# Patient Record
Sex: Male | Born: 1975 | Race: White | Hispanic: No | Smoking: Former smoker
Health system: Southern US, Community
[De-identification: ages and names within clinical notes are randomized; demographics above are authoritative.]

## PROBLEM LIST (undated history)

## (undated) DIAGNOSIS — F32A Depression, unspecified: Secondary | ICD-10-CM

## (undated) DIAGNOSIS — F419 Anxiety disorder, unspecified: Secondary | ICD-10-CM

## (undated) DIAGNOSIS — F329 Major depressive disorder, single episode, unspecified: Secondary | ICD-10-CM

## (undated) DIAGNOSIS — D849 Immunodeficiency, unspecified: Secondary | ICD-10-CM

## (undated) DIAGNOSIS — Z21 Asymptomatic human immunodeficiency virus [HIV] infection status: Secondary | ICD-10-CM

## (undated) DIAGNOSIS — K219 Gastro-esophageal reflux disease without esophagitis: Secondary | ICD-10-CM

---

## 2007-10-11 ENCOUNTER — Emergency Department: Payer: Self-pay | Admitting: Emergency Medicine

## 2008-01-24 ENCOUNTER — Emergency Department: Payer: Self-pay | Admitting: Emergency Medicine

## 2008-05-17 ENCOUNTER — Emergency Department: Payer: Self-pay | Admitting: Emergency Medicine

## 2009-08-23 ENCOUNTER — Emergency Department: Payer: Self-pay | Admitting: Emergency Medicine

## 2009-10-30 ENCOUNTER — Emergency Department: Payer: Self-pay | Admitting: Emergency Medicine

## 2010-01-10 ENCOUNTER — Emergency Department: Payer: Self-pay | Admitting: Emergency Medicine

## 2010-03-07 ENCOUNTER — Emergency Department: Payer: Self-pay | Admitting: Internal Medicine

## 2012-10-04 ENCOUNTER — Emergency Department: Payer: Self-pay | Admitting: Emergency Medicine

## 2012-10-04 LAB — COMPREHENSIVE METABOLIC PANEL
Albumin: 3.8 g/dL (ref 3.4–5.0)
Alkaline Phosphatase: 85 U/L (ref 50–136)
Calcium, Total: 9.2 mg/dL (ref 8.5–10.1)
Chloride: 104 mmol/L (ref 98–107)
Co2: 27 mmol/L (ref 21–32)
EGFR (African American): >60
EGFR (Non-African Amer.): >60
Osmolality: 276 (ref 275–301)
SGOT(AST): 41 U/L — ABNORMAL HIGH (ref 15–37)
SGPT (ALT): 52 U/L (ref 12–78)
Sodium: 139 mmol/L (ref 136–145)

## 2012-10-04 LAB — CBC
MCH: 33 pg (ref 26.0–34.0)
Platelet: 205 10*3/uL (ref 150–440)
RBC: 4.92 10*6/uL (ref 4.40–5.90)
RDW: 14.2 % (ref 11.5–14.5)
WBC: 6.1 10*3/uL (ref 3.8–10.6)

## 2012-10-10 ENCOUNTER — Emergency Department: Payer: Self-pay | Admitting: Emergency Medicine

## 2012-10-10 LAB — BASIC METABOLIC PANEL
Anion Gap: 9 (ref 7–16)
Chloride: 105 mmol/L (ref 98–107)
Co2: 26 mmol/L (ref 21–32)
Creatinine: 1.21 mg/dL (ref 0.60–1.30)
EGFR (African American): >60
Potassium: 3.8 mmol/L (ref 3.5–5.1)
Sodium: 140 mmol/L (ref 136–145)

## 2012-10-10 LAB — CBC
HCT: 40.8 % (ref 40.0–52.0)
HGB: 14.4 g/dL (ref 13.0–18.0)
MCH: 32.2 pg (ref 26.0–34.0)
MCHC: 35.3 g/dL (ref 32.0–36.0)
MCV: 91 fL (ref 80–100)
RBC: 4.48 10*6/uL (ref 4.40–5.90)

## 2013-10-06 ENCOUNTER — Emergency Department: Payer: Self-pay | Admitting: Emergency Medicine

## 2013-11-21 ENCOUNTER — Emergency Department: Payer: Self-pay | Admitting: Emergency Medicine

## 2013-11-21 LAB — CBC
Platelet: 257 10*3/uL (ref 150–440)
RBC: 4.42 10*6/uL (ref 4.40–5.90)

## 2013-11-21 LAB — COMPREHENSIVE METABOLIC PANEL
Anion Gap: 5 — ABNORMAL LOW (ref 7–16)
BUN: 17 mg/dL (ref 7–18)
Co2: 26 mmol/L (ref 21–32)
EGFR (African American): >60
Glucose: 116 mg/dL — ABNORMAL HIGH (ref 65–99)
SGOT(AST): 36 U/L (ref 15–37)
SGPT (ALT): 32 U/L (ref 12–78)
Sodium: 138 mmol/L (ref 136–145)

## 2013-11-21 LAB — URINALYSIS, COMPLETE
Bacteria: NONE SEEN
Bilirubin,UR: NEGATIVE
Glucose,UR: NEGATIVE mg/dL (ref 0–75)
Nitrite: NEGATIVE
Ph: 5 (ref 4.5–8.0)
RBC,UR: 193 /HPF (ref 0–5)
Specific Gravity: 1.021 (ref 1.003–1.030)
Squamous Epithelial: <1

## 2014-06-05 ENCOUNTER — Emergency Department: Payer: Self-pay | Admitting: Emergency Medicine

## 2014-07-26 ENCOUNTER — Emergency Department: Payer: Self-pay | Admitting: Emergency Medicine

## 2014-10-01 ENCOUNTER — Emergency Department: Payer: Self-pay | Admitting: Emergency Medicine

## 2014-10-18 ENCOUNTER — Emergency Department: Payer: Self-pay | Admitting: Emergency Medicine

## 2014-10-18 LAB — PHOSPHORUS: Phosphorus: 3.1 mg/dL (ref 2.5–4.9)

## 2014-10-18 LAB — COMPREHENSIVE METABOLIC PANEL
AST: 18 U/L (ref 15–37)
Albumin: 3 g/dL — ABNORMAL LOW (ref 3.4–5.0)
Alkaline Phosphatase: 87 U/L
Anion Gap: 9 (ref 7–16)
BILIRUBIN TOTAL: 1.3 mg/dL — AB (ref 0.2–1.0)
BUN: 13 mg/dL (ref 7–18)
CALCIUM: 9 mg/dL (ref 8.5–10.1)
CHLORIDE: 101 mmol/L (ref 98–107)
CO2: 26 mmol/L (ref 21–32)
Creatinine: 1.48 mg/dL — ABNORMAL HIGH (ref 0.60–1.30)
EGFR (African American): >60
EGFR (Non-African Amer.): 56 — ABNORMAL LOW
Glucose: 107 mg/dL — ABNORMAL HIGH (ref 65–99)
OSMOLALITY: 273 (ref 275–301)
Potassium: 3.9 mmol/L (ref 3.5–5.1)
SGPT (ALT): 30 U/L
Sodium: 136 mmol/L (ref 136–145)
TOTAL PROTEIN: 8.4 g/dL — AB (ref 6.4–8.2)

## 2014-10-18 LAB — CBC
HCT: 41.9 % (ref 40.0–52.0)
HGB: 13.6 g/dL (ref 13.0–18.0)
MCH: 29.5 pg (ref 26.0–34.0)
MCHC: 32.6 g/dL (ref 32.0–36.0)
MCV: 91 fL (ref 80–100)
Platelet: 341 10*3/uL (ref 150–440)
RBC: 4.63 10*6/uL (ref 4.40–5.90)
RDW: 14.5 % (ref 11.5–14.5)
WBC: 12.9 10*3/uL — AB (ref 3.8–10.6)

## 2014-10-18 LAB — PROTIME-INR
INR: 1
Prothrombin Time: 13.3 secs (ref 11.5–14.7)

## 2014-10-18 LAB — MAGNESIUM: Magnesium: 1.8 mg/dL

## 2014-10-18 LAB — TROPONIN I: Troponin-I: <0.02 ng/mL

## 2014-10-19 LAB — URINALYSIS, COMPLETE
BACTERIA: NONE SEEN
Bilirubin,UR: NEGATIVE
Blood: NEGATIVE
Glucose,UR: NEGATIVE mg/dL (ref 0–75)
KETONE: NEGATIVE
Leukocyte Esterase: NEGATIVE
NITRITE: NEGATIVE
PH: 7 (ref 4.5–8.0)
Protein: NEGATIVE
RBC,UR: NONE SEEN /HPF (ref 0–5)
SPECIFIC GRAVITY: 1.021 (ref 1.003–1.030)
Squamous Epithelial: NONE SEEN
WBC UR: NONE SEEN /HPF (ref 0–5)

## 2014-10-20 LAB — URINE CULTURE

## 2014-10-23 LAB — CULTURE, BLOOD (SINGLE)

## 2014-12-05 ENCOUNTER — Emergency Department: Payer: Self-pay | Admitting: Emergency Medicine

## 2014-12-08 ENCOUNTER — Emergency Department: Payer: Self-pay | Admitting: Emergency Medicine

## 2014-12-08 LAB — BASIC METABOLIC PANEL
ANION GAP: 4 — AB (ref 7–16)
BUN: 17 mg/dL (ref 7–18)
CALCIUM: 9 mg/dL (ref 8.5–10.1)
CHLORIDE: 108 mmol/L — AB (ref 98–107)
CO2: 26 mmol/L (ref 21–32)
Creatinine: 0.96 mg/dL (ref 0.60–1.30)
EGFR (African American): >60
EGFR (Non-African Amer.): >60
Glucose: 91 mg/dL (ref 65–99)
OSMOLALITY: 277 (ref 275–301)
Potassium: 3.5 mmol/L (ref 3.5–5.1)
SODIUM: 138 mmol/L (ref 136–145)

## 2014-12-08 LAB — CBC WITH DIFFERENTIAL/PLATELET
Basophil #: 0.1 10*3/uL (ref 0.0–0.1)
Basophil %: 0.7 %
EOS ABS: 0.2 10*3/uL (ref 0.0–0.7)
EOS PCT: 1.7 %
HCT: 42.8 % (ref 40.0–52.0)
HGB: 14.2 g/dL (ref 13.0–18.0)
Lymphocyte #: 1.9 10*3/uL (ref 1.0–3.6)
Lymphocyte %: 18.7 %
MCH: 30.4 pg (ref 26.0–34.0)
MCHC: 33.1 g/dL (ref 32.0–36.0)
MCV: 92 fL (ref 80–100)
MONO ABS: 1 x10 3/mm (ref 0.2–1.0)
Monocyte %: 9.2 %
Neutrophil #: 7.2 10*3/uL — ABNORMAL HIGH (ref 1.4–6.5)
Neutrophil %: 69.7 %
Platelet: 279 10*3/uL (ref 150–440)
RBC: 4.65 10*6/uL (ref 4.40–5.90)
RDW: 16.5 % — AB (ref 11.5–14.5)
WBC: 10.4 10*3/uL (ref 3.8–10.6)

## 2015-06-26 ENCOUNTER — Encounter: Payer: Self-pay | Admitting: Emergency Medicine

## 2015-06-26 ENCOUNTER — Emergency Department
Admission: EM | Admit: 2015-06-26 | Discharge: 2015-06-26 | Disposition: A | Discharge status: Home / Self Care | Payer: Self-pay | Attending: Emergency Medicine | Admitting: Emergency Medicine

## 2015-06-26 ENCOUNTER — Emergency Department: Payer: Self-pay

## 2015-06-26 DIAGNOSIS — Y9289 Other specified places as the place of occurrence of the external cause: Secondary | ICD-10-CM | POA: Insufficient documentation

## 2015-06-26 DIAGNOSIS — S60221A Contusion of right hand, initial encounter: Secondary | ICD-10-CM | POA: Insufficient documentation

## 2015-06-26 DIAGNOSIS — W228XXA Striking against or struck by other objects, initial encounter: Secondary | ICD-10-CM | POA: Insufficient documentation

## 2015-06-26 DIAGNOSIS — Z720 Tobacco use: Secondary | ICD-10-CM | POA: Insufficient documentation

## 2015-06-26 DIAGNOSIS — Y9389 Activity, other specified: Secondary | ICD-10-CM | POA: Insufficient documentation

## 2015-06-26 DIAGNOSIS — Y990 Civilian activity done for income or pay: Secondary | ICD-10-CM | POA: Insufficient documentation

## 2015-06-26 HISTORY — DX: Anxiety disorder, unspecified: F41.9

## 2015-06-26 MED ORDER — MELOXICAM 15 MG PO TABS: 15.0000 mg | ORAL_TABLET | Freq: Every day | ORAL | Status: DC | PRN

## 2015-06-26 MED ORDER — OXYCODONE-ACETAMINOPHEN 5-325 MG PO TABS: 1.0000 | ORAL_TABLET | Freq: Three times a day (TID) | ORAL | Status: DC | PRN

## 2015-06-26 NOTE — Discharge Instructions (Signed)
Wear splint as needed for pain. Take medications as prescribed. Apply ice and rest. Avoid strenuous activity. Follow-up with orthopedic as needed for continued pain.  Return to the ER for new or worsening concerns.  Contusion A contusion is a deep bruise. Contusions are the result of an injury that caused bleeding under the skin. The contusion may turn blue, purple, or yellow. Minor injuries will give you a painless contusion, but more severe contusions may stay painful and swollen for a few weeks.  CAUSES  A contusion is usually caused by a blow, trauma, or direct force to an area of the body. SYMPTOMS   Swelling and redness of the injured area.  Bruising of the injured area.  Tenderness and soreness of the injured area.  Pain. DIAGNOSIS  The diagnosis can be made by taking a history and physical exam. An X-ray, CT scan, or MRI may be needed to determine if there were any associated injuries, such as fractures. TREATMENT  Specific treatment will depend on what area of the body was injured. In general, the best treatment for a contusion is resting, icing, elevating, and applying cold compresses to the injured area. Over-the-counter medicines may also be recommended for pain control. Ask your caregiver what the best treatment is for your contusion. HOME CARE INSTRUCTIONS   Put ice on the injured area.  Put ice in a plastic bag.  Place a towel between your skin and the bag.  Leave the ice on for 15-20 minutes, 3-4 times a day, or as directed by your health care provider.  Only take over-the-counter or prescription medicines for pain, discomfort, or fever as directed by your caregiver. Your caregiver may recommend avoiding anti-inflammatory medicines (aspirin, ibuprofen, and naproxen) for 48 hours because these medicines may increase bruising.  Rest the injured area.  If possible, elevate the injured area to reduce swelling. SEEK IMMEDIATE MEDICAL CARE IF:   You have increased  bruising or swelling.  You have pain that is getting worse.  Your swelling or pain is not relieved with medicines. MAKE SURE YOU:   Understand these instructions.  Will watch your condition.  Will get help right away if you are not doing well or get worse. Document Released: 09/09/2005 Document Revised: 12/05/2013 Document Reviewed: 10/05/2011 [facility] Patient Information 2015 [employer] [place]. This information is not intended to replace advice given to you by your health care provider. Make sure you discuss any questions you have with your health care provider.

## 2015-06-26 NOTE — ED Notes (Signed)
States he had something fall on right hand about 3 weeks ago. conts to have pain

## 2015-06-26 NOTE — ED Provider Notes (Signed)
[facility] Emergency Department Provider Note  ____________________________________________  Time seen: Approximately 3:44 PM  I have reviewed the triage vital signs and the nursing notes.   HISTORY  Chief Complaint Hand Pain   HPI Lance Reynolds is a 39 y.o. male presents to the ER for complaints of right lateral hand pain. Patient reports that 2-3 weeks ago he was at work and and he hit his hand on a car part or a car part fell on his right hand that he has had continued pain since then. Denies fall or other injury. Denies head injury or loss consciousness. Patient reports that he is right-handed and has pain with gripping things and with frequent movements.   Patient states that current pain is 6 out of 10 to right lateral hand. Patient states that pain is primarily with movement. Denies pain radiation. Denies break in skin. Denies other injury.     Past Medical History  Diagnosis Date  . Anxiety     There are no active problems to display for this patient.   History reviewed. No pertinent past surgical history.  No current outpatient prescriptions on file.  Allergies Tramadol  History reviewed. No pertinent family history.  Social History History  Substance Use Topics  . Smoking status: Current Every Day Smoker  . Smokeless tobacco: Not on file  . Alcohol Use: No    Review of Systems Constitutional: No fever/chills Eyes: No visual changes. ENT: No sore throat. Cardiovascular: Denies chest pain. Respiratory: Denies shortness of breath. Gastrointestinal: No abdominal pain.  No nausea, no vomiting.  No diarrhea.  No constipation. Genitourinary: Negative for dysuria. Musculoskeletal: Negative for back pain.right hand pain. Skin: Negative for rash. Neurological: Negative for headaches, focal weakness or numbness.  10-point ROS otherwise negative.  ____________________________________________   PHYSICAL EXAM:  VITAL SIGNS: ED  Triage Vitals  Enc Vitals Group     BP 06/26/15 1405 121/77 mmHg     Pulse Rate 06/26/15 1405 80     Resp 06/26/15 1405 20     Temp 06/26/15 1405 98.6 F (37 C)     Temp Source 06/26/15 1405 Oral     SpO2 06/26/15 1405 98 %     Weight 06/26/15 1405 198 lb (89.812 kg)     Height 06/26/15 1405 6\' 1"  (1.854 m)     Head Cir --      Peak Flow --      Pain Score 06/26/15 1405 8     Pain Loc --      Pain Edu? --      Excl. in GC? --     Constitutional: Alert and oriented. Well appearing and in no acute distress. Eyes: Conjunctivae are normal. PERRL. EOMI. Head: Atraumatic. Nose: No congestion/rhinnorhea. Mouth/Throat: Mucous membranes are moist.   Neck: No stridor.  No cervical spine tenderness to palpation. Hematological/Lymphatic/Immunilogical: No cervical lymphadenopathy. Cardiovascular: Normal rate, regular rhythm. Grossly normal heart sounds.  Good peripheral circulation. Respiratory: Normal respiratory effort.  No retractions. Lungs CTAB. Gastrointestinal: Soft and nontender. No distention.  Musculoskeletal: No lower or upper extremity tenderness nor edema.  No joint effusions. Right hand mild to mod TTP over dorsal 4/5 metacarpal, no swelling or ecchymosis. No erythema, induration or drainage. Motor and sensation intact. No tendon deficit.  Neurologic:  Normal speech and language. No gross focal neurologic deficits are appreciated. No gait instability. Skin:  Skin is warm, dry and intact. No rash noted. Psychiatric: Mood and affect are normal. Speech and  behavior are normal.  ____________________________________________   LABS (all labs ordered are listed, but only abnormal results are displayed)  Labs Reviewed - No data to display  RADIOLOGY  RIGHT HAND - COMPLETE 3+ VIEW  COMPARISON: None.  FINDINGS: There is no evidence of fracture or dislocation. There is no evidence of arthropathy or other focal bone abnormality. Soft tissues are  unremarkable.  IMPRESSION: Negative.   Electronically Signed By: Elige Ko On: 06/26/2015 15:59  I, Renford Dills, personally discussed these images and results by phone with the on-call radiologist and used this discussion as part of my medical decision making.   ____________________________________________   PROCEDURES  Procedure(s) performed:  SPLINT APPLICATION Date/Time: 4:16 PM Authorized by: Renford Dills Consent: Verbal consent obtained. Risks and benefits: risks, benefits and alternatives were discussed Consent given by: patient Splint applied by: ed technician Location details: right velcro cock up splint Post-procedure: The splinted body part was neurovascularly unchanged following the procedure. Patient tolerance: Patient tolerated the procedure well with no immediate complications.     _________________________________   INITIAL IMPRESSION / ASSESSMENT AND PLAN / ED COURSE  Pertinent labs & imaging results that were available during my care of the patient were reviewed by me and considered in my medical decision making (see chart for details).  Very well-appearing patient. No acute distress. Presents the ER for the complaint of right lateral hand pain post injury 2-3 weeks ago after hitting it on a car part. Patient denies other pain or other injury. Patient reports he is right-handed and continues to have intermittent pain. Patient states the pain is primarily with movement. Denies numbness or tingling sensation. Right hand x-ray negative for acute bony abnormality. Discussed with patient to rest and apply ice and elevate. We'll discharge patient with a Velcro cock-up splint for her restless instability. When necessary Mobic, #4 percocet. Patient to follow-up with orthopedic as needed for continued pain. Patient verbalized understanding and agreed to plan. ____________________________________________   FINAL CLINICAL IMPRESSION(S) / ED  DIAGNOSES  Final diagnoses:  Hand contusion, right, initial encounter      Renford Dills, NP 06/26/15 1619  Minna Antis, MD 06/26/15 2031

## 2015-07-17 ENCOUNTER — Emergency Department
Admission: EM | Admit: 2015-07-17 | Discharge: 2015-07-17 | Disposition: A | Discharge status: Home / Self Care | Payer: Self-pay | Attending: Emergency Medicine | Admitting: Emergency Medicine

## 2015-07-17 ENCOUNTER — Encounter: Payer: Self-pay | Admitting: Emergency Medicine

## 2015-07-17 DIAGNOSIS — Z720 Tobacco use: Secondary | ICD-10-CM | POA: Insufficient documentation

## 2015-07-17 DIAGNOSIS — R21 Rash and other nonspecific skin eruption: Secondary | ICD-10-CM | POA: Insufficient documentation

## 2015-07-17 DIAGNOSIS — L720 Epidermal cyst: Secondary | ICD-10-CM | POA: Insufficient documentation

## 2015-07-17 DIAGNOSIS — J029 Acute pharyngitis, unspecified: Secondary | ICD-10-CM | POA: Insufficient documentation

## 2015-07-17 LAB — CBC WITH DIFFERENTIAL/PLATELET
BASOS ABS: 0 10*3/uL (ref 0–0.1)
Basophils Relative: 0 %
Eosinophils Absolute: 0.3 10*3/uL (ref 0–0.7)
Eosinophils Relative: 5 %
HCT: 37.1 % — ABNORMAL LOW (ref 40.0–52.0)
HEMOGLOBIN: 12.4 g/dL — AB (ref 13.0–18.0)
Lymphocytes Relative: 21 %
Lymphs Abs: 1.3 10*3/uL (ref 1.0–3.6)
MCH: 29.7 pg (ref 26.0–34.0)
MCHC: 33.4 g/dL (ref 32.0–36.0)
MCV: 88.9 fL (ref 80.0–100.0)
MONO ABS: 0.4 10*3/uL (ref 0.2–1.0)
Monocytes Relative: 7 %
NEUTROS ABS: 3.9 10*3/uL (ref 1.4–6.5)
Neutrophils Relative %: 67 %
Platelets: 250 10*3/uL (ref 150–440)
RBC: 4.17 MIL/uL — AB (ref 4.40–5.90)
RDW: 14.5 % (ref 11.5–14.5)
WBC: 6 10*3/uL (ref 3.8–10.6)

## 2015-07-17 LAB — COMPREHENSIVE METABOLIC PANEL
ALK PHOS: 46 U/L (ref 38–126)
ALT: 12 U/L — ABNORMAL LOW (ref 17–63)
ANION GAP: 7 (ref 5–15)
AST: 19 U/L (ref 15–41)
Albumin: 3.3 g/dL — ABNORMAL LOW (ref 3.5–5.0)
BUN: 13 mg/dL (ref 6–20)
CALCIUM: 8.5 mg/dL — AB (ref 8.9–10.3)
CHLORIDE: 103 mmol/L (ref 101–111)
CO2: 23 mmol/L (ref 22–32)
CREATININE: 0.9 mg/dL (ref 0.61–1.24)
GFR calc Af Amer: >60 mL/min (ref >60)
GFR calc non Af Amer: >60 mL/min (ref >60)
Glucose, Bld: 104 mg/dL — ABNORMAL HIGH (ref 65–99)
POTASSIUM: 3.5 mmol/L (ref 3.5–5.1)
SODIUM: 133 mmol/L — AB (ref 135–145)
Total Bilirubin: 0.5 mg/dL (ref 0.3–1.2)
Total Protein: 7.9 g/dL (ref 6.5–8.1)

## 2015-07-17 LAB — POCT RAPID STREP A: Streptococcus, Group A Screen (Direct): NEGATIVE

## 2015-07-17 MED ORDER — CEPHALEXIN 500 MG PO CAPS: 500.0000 mg | ORAL_CAPSULE | Freq: Four times a day (QID) | ORAL | Status: AC

## 2015-07-17 MED ORDER — SODIUM CHLORIDE 0.9 % IV BOLUS (SEPSIS)
1000.0000 mL | Freq: Once | INTRAVENOUS | Status: AC
  Administered 2015-07-17: 1000 mL via INTRAVENOUS

## 2015-07-17 MED ORDER — PERMETHRIN 5 % EX CREA: TOPICAL_CREAM | CUTANEOUS | Status: DC

## 2015-07-17 MED ORDER — ACETAMINOPHEN-CODEINE #3 300-30 MG PO TABS: 1.0000 | ORAL_TABLET | ORAL | Status: DC | PRN

## 2015-07-17 NOTE — ED Provider Notes (Signed)
[facility] Emergency Department Provider Note ____________________________________________  Time seen: Approximately 6:52 PM   I have reviewed the triage vital signs and the nursing notes.   HISTORY  Chief Complaint Sore Throat   HPI Lance Reynolds is a 39 y.o. male who presents to the emergency department for evaluation of sore throat, lesion on his forehead, and rash. Sore throat has been present for the past 2 weeks. Lesion on his head has been getting larger over the past few days. Rash has been present for an unknown period of time. He describes it as pruritic. He states it might be from bites that he received while lying on the ground working underneath some machinery.   Past Medical History  Diagnosis Date  . Anxiety     There are no active problems to display for this patient.   History reviewed. No pertinent past surgical history.  Current Outpatient Rx  Name  Route  Sig  Dispense  Refill  . acetaminophen-codeine (TYLENOL #3) 300-30 MG per tablet   Oral   Take 1-2 tablets by mouth every 4 (four) hours as needed for moderate pain.   12 tablet   0   . cephALEXin (KEFLEX) 500 MG capsule   Oral   Take 1 capsule (500 mg total) by mouth 4 (four) times daily.   40 capsule   0   . meloxicam (MOBIC) 15 MG tablet   Oral   Take 1 tablet (15 mg total) by mouth daily as needed for pain.   10 tablet   0   . oxyCODONE-acetaminophen (ROXICET) 5-325 MG per tablet   Oral   Take 1 tablet by mouth every 8 (eight) hours as needed for moderate pain or severe pain (Do not drive or operate heavy machinery while taking as can cause drowsiness.).   4 tablet   0   . permethrin (ELIMITE) 5 % cream      Leave on overnight, then wash off in the morning. Repeat in 1 week   60 g   1     Allergies Tramadol  No family history on file.  Social History History  Substance Use Topics  . Smoking status: Current Every Day Smoker  . Smokeless tobacco:  Not on file  . Alcohol Use: No    Review of Systems Constitutional: No fever/positive for chills Eyes: No visual changes. ENT: Positive for sore throat. Cardiovascular: Denies chest pain. Respiratory: Denies shortness of breath. Gastrointestinal: No abdominal pain.  No nausea, no vomiting.  No diarrhea.  No constipation. Genitourinary: Negative for dysuria. Musculoskeletal: Negative for back pain. Skin: Negative for rash. Neurological: Negative for headaches, focal weakness or numbness.  10-point ROS otherwise negative.  ____________________________________________   PHYSICAL EXAM:  VITAL SIGNS: ED Triage Vitals  Enc Vitals Group     BP 07/17/15 1752 120/73 mmHg     Pulse Rate 07/17/15 1752 92     Resp 07/17/15 1752 20     Temp 07/17/15 1752 98.5 F (36.9 C)     Temp Source 07/17/15 1752 Oral     SpO2 07/17/15 1752 97 %     Weight 07/17/15 1751 190 lb (86.183 kg)     Height 07/17/15 1751 6\' 1"  (1.854 m)     Head Cir --      Peak Flow --      Pain Score 07/17/15 1751 9     Pain Loc --      Pain Edu? --  Excl. in GC? --     Constitutional: Alert and oriented. Very unkempt. Eyes: Conjunctivae are normal. PERRL. EOMI. Head: Atraumatic. Nose: No congestion/rhinnorhea. Mouth/Throat: Mucous membranes are  dry.  Oropharynx mildly erythematous without tonsillar edema or exudate. No uvular deviation. Neck: No stridor.   Cardiovascular: Normal rate, regular rhythm. Grossly normal heart sounds.  Good peripheral circulation. Respiratory: Normal respiratory effort.  No retractions. Lungs CTAB. Gastrointestinal: Soft and nontender. No distention. No abdominal bruits. No CVA tenderness. Musculoskeletal: No lower extremity tenderness nor edema.  No joint effusions. Neurologic:  Normal speech and language. No gross focal neurologic deficits are appreciated. No gait instability. Skin:  Skin is warm, dry and intact. Erythematous, scabbed rash noted over hands, wrists and  forearms. Epidermal cyst noted in the hairline of the frontal scalp area without cellulitis. Psychiatric: Mood and affect are normal. Speech and behavior are normal.  ____________________________________________   LABS (all labs ordered are listed, but only abnormal results are displayed)  Labs Reviewed  CBC WITH DIFFERENTIAL/PLATELET - Abnormal; Notable for the following:    RBC 4.17 (*)    Hemoglobin 12.4 (*)    HCT 37.1 (*)    All other components within normal limits  COMPREHENSIVE METABOLIC PANEL - Abnormal; Notable for the following:    Sodium 133 (*)    Glucose, Bld 104 (*)    Calcium 8.5 (*)    Albumin 3.3 (*)    ALT 12 (*)    All other components within normal limits  POCT RAPID STREP A   ____________________________________________  EKG   ____________________________________________  RADIOLOGY   ____________________________________________   PROCEDURES  Procedure(s) performed: None  Critical Care performed: No  ____________________________________________   INITIAL IMPRESSION / ASSESSMENT AND PLAN / ED COURSE  Pertinent labs & imaging results that were available during my care of the patient were reviewed by me and considered in my medical decision making (see chart for details).  1 L of IV fluid was given in the emergency department. Patient was advised to use the permethrin cream as prescribed overnight and wash off in the morning. He was strongly advised to establish a primary care provider for a complete physical. He was advised to return to the emergency department for symptoms change or worsen if unable schedule an appointment. ____________________________________________   FINAL CLINICAL IMPRESSION(S) / ED DIAGNOSES  Final diagnoses:  Epidermal cyst  Pharyngitis  Rash      Chinita Pester, FNP 07/17/15 2116  Sharman Cheek, MD 07/17/15 2310

## 2015-07-17 NOTE — ED Notes (Signed)
Assess per PA 

## 2015-07-17 NOTE — ED Notes (Signed)
Sore throat for few weeks .Marland Kitchen Pain is getting worse.Marland Kitchen

## 2015-07-17 NOTE — ED Notes (Signed)
AAOx3.  Skin warm and dry.  Reinforced importance of follow up with PCP.Marland Kitchen understanding verbalized.  D/C home

## 2015-07-17 NOTE — ED Notes (Signed)
Pt reports sore throat and "lump" on head for months.

## 2015-07-31 ENCOUNTER — Emergency Department
Admission: EM | Admit: 2015-07-31 | Discharge: 2015-07-31 | Disposition: A | Discharge status: Home / Self Care | Payer: Self-pay | Attending: Emergency Medicine | Admitting: Emergency Medicine

## 2015-07-31 ENCOUNTER — Encounter: Payer: Self-pay | Admitting: Emergency Medicine

## 2015-07-31 DIAGNOSIS — Z79899 Other long term (current) drug therapy: Secondary | ICD-10-CM | POA: Insufficient documentation

## 2015-07-31 DIAGNOSIS — B370 Candidal stomatitis: Secondary | ICD-10-CM

## 2015-07-31 DIAGNOSIS — Z720 Tobacco use: Secondary | ICD-10-CM | POA: Insufficient documentation

## 2015-07-31 DIAGNOSIS — B379 Candidiasis, unspecified: Secondary | ICD-10-CM | POA: Insufficient documentation

## 2015-07-31 DIAGNOSIS — K141 Geographic tongue: Secondary | ICD-10-CM | POA: Insufficient documentation

## 2015-07-31 MED ORDER — LIDOCAINE VISCOUS 2 % MT SOLN
15.0000 mL | Freq: Once | OROMUCOSAL | Status: AC
  Administered 2015-07-31: 15 mL via OROMUCOSAL
  Filled 2015-07-31: qty 15

## 2015-07-31 MED ORDER — FIRST-DUKES MOUTHWASH MT SUSP: 10.0000 mL | Freq: Four times a day (QID) | OROMUCOSAL | Status: DC

## 2015-07-31 NOTE — ED Notes (Signed)
Patient to ED with c/o painful white bumps to inside of mouth since yesterday, currently being treated with antibiotics.

## 2015-07-31 NOTE — Discharge Instructions (Signed)
Candida Infection °A Candida infection (also called yeast, fungus, and Monilia infection) is an overgrowth of yeast that can occur anywhere on the body. A yeast infection commonly occurs in warm, moist body areas. Usually, the infection remains localized but can spread to become a systemic infection. A yeast infection may be a sign of a more severe disease such as diabetes, leukemia, or AIDS. °A yeast infection can occur in both men and women. In women, Candida vaginitis is a vaginal infection. It is one of the most common causes of vaginitis. Men usually do not have symptoms or know they have an infection until other problems develop. Men may find out they have a yeast infection because their sex partner has a yeast infection. Uncircumcised men are more likely to get a yeast infection than circumcised men. This is because the uncircumcised glans is not exposed to air and does not remain as dry as that of a circumcised glans. Older adults may develop yeast infections around dentures. °CAUSES  °Women °· Antibiotics. °· Steroid medication taken for a long time. °· Being overweight (obese). °· Diabetes. °· Poor immune condition. °· Certain serious medical conditions. °· Immune suppressive medications for organ transplant patients. °· Chemotherapy. °· Pregnancy. °· Menstruation. °· Stress and fatigue. °· Intravenous drug use. °· Oral contraceptives. °· Wearing tight-fitting clothes in the crotch area. °· Catching it from a sex partner who has a yeast infection. °· Spermicide. °· Intravenous, urinary, or other catheters. °Men °· Catching it from a sex partner who has a yeast infection. °· Having oral or anal sex with a person who has the infection. °· Spermicide. °· Diabetes. °· Antibiotics. °· Poor immune system. °· Medications that suppress the immune system. °· Intravenous drug use. °· Intravenous, urinary, or other catheters. °SYMPTOMS  °Women °· Thick, white vaginal discharge. °· Vaginal itching. °· Redness and  swelling in and around the vagina. °· Irritation of the lips of the vagina and perineum. °· Blisters on the vaginal lips and perineum. °· Painful sexual intercourse. °· Low blood sugar (hypoglycemia). °· Painful urination. °· Bladder infections. °· Intestinal problems such as constipation, indigestion, bad breath, bloating, increase in gas, diarrhea, or loose stools. °Men °· Men may develop intestinal problems such as constipation, indigestion, bad breath, bloating, increase in gas, diarrhea, or loose stools. °· Dry, cracked skin on the penis with itching or discomfort. °· Jock itch. °· Dry, flaky skin. °· Athlete's foot. °· Hypoglycemia. °DIAGNOSIS  °Women °· A history and an exam are performed. °· The discharge may be examined under a microscope. °· A culture may be taken of the discharge. °Men °· A history and an exam are performed. °· Any discharge from the penis or areas of cracked skin will be looked at under the microscope and cultured. °· Stool samples may be cultured. °TREATMENT  °Women °· Vaginal antifungal suppositories and creams. °· Medicated creams to decrease irritation and itching on the outside of the vagina. °· Warm compresses to the perineal area to decrease swelling and discomfort. °· Oral antifungal medications. °· Medicated vaginal suppositories or cream for repeated or recurrent infections. °· Wash and dry the irritation areas before applying the cream. °· Eating yogurt with Lactobacillus may help with prevention and treatment. °· Sometimes painting the vagina with gentian violet solution may help if creams and suppositories do not work. °Men °· Antifungal creams and oral antifungal medications. °· Sometimes treatment must continue for 30 days after the symptoms go away to prevent recurrence. °HOME CARE INSTRUCTIONS  °  Women °· Use cotton underwear and avoid tight-fitting clothing. °· Avoid colored, scented toilet paper and deodorant tampons or pads. °· Do not douche. °· Keep your diabetes  under control. °· Finish all the prescribed medications. °· Keep your skin clean and dry. °· Consume milk or yogurt with Lactobacillus-active culture regularly. If you get frequent yeast infections and think that is what the infection is, there are over-the-counter medications that you can get. If the infection does not show healing in 3 days, talk to your caregiver. °· Tell your sex partner you have a yeast infection. Your partner may need treatment also, especially if your infection does not clear up or recurs. °Men °· Keep your skin clean and dry. °· Keep your diabetes under control. °· Finish all prescribed medications. °· Tell your sex partner that you have a yeast infection so he or she can be treated if necessary. °SEEK MEDICAL CARE IF:  °· Your symptoms do not clear up or worsen in one week after treatment. °· You have an oral temperature above 102° F (38.9° C). °· You have trouble swallowing or eating for a prolonged time. °· You develop blisters on and around your vagina. °· You develop vaginal bleeding and it is not your menstrual period. °· You develop abdominal pain. °· You develop intestinal problems as mentioned above. °· You get weak or light-headed. °· You have painful or increased urination. °· You have pain during sexual intercourse. °MAKE SURE YOU:  °· Understand these instructions. °· Will watch your condition. °· Will get help right away if you are not doing well or get worse. °Document Released: 01/07/2005 Document Revised: 04/16/2014 Document Reviewed: 04/21/2010 °Exit[employer]® Patient Information ©2015 [employer]. This information is not intended to replace advice given to you by your health care provider. Make sure you discuss any questions you have with your health care provider. °Thrush, Adult  °Thrush, also called oral candidiasis, is a fungal infection that develops in the mouth and throat and on the tongue. It causes white patches to form on the mouth and tongue. Thrush is most common  in older adults, but it can occur at any age.  °Many cases of thrush are mild, but this infection can also be more serious. Thrush can be a recurring problem for people who have chronic illnesses or who take medicines that limit the body's ability to fight infection. Because these people have difficulty fighting infections, the fungus that causes thrush can spread throughout the body. This can cause life-threatening blood or organ infections. °CAUSES  °Thrush is usually caused by a yeast called Candida albicans. This fungus is normally present in small amounts in the mouth and on other mucous membranes. It usually causes no harm. However, when conditions are present that allow the fungus to grow uncontrolled, it invades surrounding tissues and becomes an infection. Less often, other Candida species can also lead to thrush.  °RISK FACTORS °Thrush is more likely to develop in the following people: °People with an impaired ability to fight infection (weakened immune system).   °Older adults.   °People with HIV.   °People with diabetes.   °People with dry mouth (xerostomia).   °Pregnant women.   °People with poor dental care, especially those who have false teeth.   °People who use antibiotic medicines.   °SIGNS AND SYMPTOMS  °Thrush can be a mild infection that causes no symptoms. If symptoms develop, they may include:  °A burning feeling in the mouth and throat. This can occur at the start of a thrush infection.   °  White patches that adhere to the mouth and tongue. The tissue around the patches may be red, raw, and painful. If rubbed (during tooth brushing, for example), the patches and the tissue of the mouth may bleed easily.   °A bad taste in the mouth or difficulty tasting foods.   °Cottony feeling in the mouth.   °Pain during eating and swallowing. °DIAGNOSIS  °Your health care provider can usually diagnose thrush by looking in your mouth and asking you questions about your health.  °TREATMENT  °Medicines that  help prevent the growth of fungi (antifungals) are the standard treatment for thrush. These medicines are either applied directly to the affected area (topical) or swallowed (oral). The treatment will depend on the severity of the condition.  °Mild Thrush °Mild cases of thrush may clear up with the use of an antifungal mouth rinse or lozenges. Treatment usually lasts about 14 days.  °Moderate to Severe Thrush °More severe thrush infections that have spread to the esophagus are treated with an oral antifungal medicine. A topical antifungal medicine may also be used.   °For some severe infections, a treatment period longer than 14 days may be needed.   °Oral antifungal medicines are almost never used during pregnancy because the fetus may be harmed. However, if a pregnant woman has a rare, severe thrush infection that has spread to her blood, oral antifungal medicines may be used. In this case, the risk of harm to the mother and fetus from the severe thrush infection may be greater than the risk posed by the use of antifungal medicines.   °Persistent or Recurrent Thrush °For cases of thrush that do not go away or keep coming back, treatment may involve the following:  °Treatment may be needed twice as long as the symptoms last.   °Treatment will include both oral and topical antifungal medicines.   °People with weakened immune systems can take an antifungal medicine on a continuous basis to prevent thrush infections.   °It is important to treat conditions that make you more likely to get thrush, such as diabetes or HIV.  °HOME CARE INSTRUCTIONS  °Only take over-the-counter or prescription medicine as directed by your health care provider. Talk to your health care provider about an over-the-counter medicine called gentian violet, which kills bacteria and fungi.   °Eat plain, unflavored yogurt as directed by your health care provider. Check the label to make sure the yogurt contains live cultures. This yogurt can help  healthy bacteria grow in the mouth that can stop the growth of the fungus that causes thrush.   °Try these measures to help reduce the discomfort of thrush:   °Drink cold liquids such as water or iced tea.   °Try flavored ice treats or frozen juices.   °Eat foods that are easy to swallow, such as gelatin, ice cream, or custard.   °If the patches in your mouth are painful, try drinking from a straw.   °Rinse your mouth several times a day with a warm saltwater rinse. You can make the saltwater mixture with 1 tsp (6 g) of salt in 8 fl oz (0.2 L) of warm water.   °If you wear dentures, remove the dentures before going to bed, brush them vigorously, and soak them in a cleaning solution as directed by your health care provider.   °Women who are breastfeeding should clean their nipples with an antifungal medicine as directed by their health care provider. Dry the nipples after breastfeeding. Applying lanolin-containing body lotion may help relieve nipple soreness.   °SEEK MEDICAL CARE IF: °Your symptoms are getting worse or are not   improving within 7 days of starting treatment.   °You have symptoms of spreading infection, such as white patches on the skin outside of the mouth.   °You are nursing and you have redness, burning, or pain in the nipples that is not relieved with treatment.   °MAKE SURE YOU: °Understand these instructions. °Will watch your condition. °Will get help right away if you are not doing well or get worse. °Document Released: 08/25/2004 Document Revised: 09/20/2013 Document Reviewed: 07/03/2013 °ExitCare® Patient Information ©2015 [employer]. This information is not intended to replace advice given to you by your health care provider. Make sure you discuss any questions you have with your health care provider. ° °

## 2015-07-31 NOTE — ED Provider Notes (Signed)
[facility] Emergency Department Provider Note  ____________________________________________  Time seen: Approximately 4:51 PM  I have reviewed the triage vital signs and the nursing notes.   HISTORY  Chief Complaint Thrush   HPI Lance Reynolds is a 39 y.o. male who was seen 14 days ago and placed on antibiotics. Complains of of whitish rash in mouth. With funny-looking tongue.   Past Medical History  Diagnosis Date  . Anxiety     There are no active problems to display for this patient.   History reviewed. No pertinent past surgical history.  Current Outpatient Rx  Name  Route  Sig  Dispense  Refill  . acetaminophen-codeine (TYLENOL #3) 300-30 MG per tablet   Oral   Take 1-2 tablets by mouth every 4 (four) hours as needed for moderate pain.   12 tablet   0   . Diphenhyd-Hydrocort-Nystatin (FIRST-DUKES MOUTHWASH) SUSP   Mouth/Throat   Use as directed 10 mLs in the mouth or throat 4 (four) times daily.   240 mL   1     With viscous lidocaine   . meloxicam (MOBIC) 15 MG tablet   Oral   Take 1 tablet (15 mg total) by mouth daily as needed for pain.   10 tablet   0   . oxyCODONE-acetaminophen (ROXICET) 5-325 MG per tablet   Oral   Take 1 tablet by mouth every 8 (eight) hours as needed for moderate pain or severe pain (Do not drive or operate heavy machinery while taking as can cause drowsiness.).   4 tablet   0   . permethrin (ELIMITE) 5 % cream      Leave on overnight, then wash off in the morning. Repeat in 1 week   60 g   1     Allergies Tramadol  History reviewed. No pertinent family history.  Social History Social History  Substance Use Topics  . Smoking status: Current Every Day Smoker  . Smokeless tobacco: None  . Alcohol Use: No    Review of Systems Constitutional: No fever/chills Eyes: No visual changes. ENT: No sore throat. Positive whitish plaque and geographic tongue noted Cardiovascular: Denies chest  pain. Respiratory: Denies shortness of breath. Gastrointestinal: No abdominal pain.  No nausea, no vomiting.  No diarrhea.  No constipation. Genitourinary: Negative for dysuria. Musculoskeletal: Negative for back pain. Skin: Negative for rash. Neurological: Negative for headaches, focal weakness or numbness.  10-point ROS otherwise negative.  ____________________________________________   PHYSICAL EXAM:  VITAL SIGNS: ED Triage Vitals  Enc Vitals Group     BP --      Pulse --      Resp --      Temp --      Temp src --      SpO2 --      Weight --      Height --      Head Cir --      Peak Flow --      Pain Score 07/31/15 1635 10     Pain Loc --      Pain Edu? --      Excl. in GC? --     Constitutional: Alert and oriented. Well appearing and in no acute distress. Eyes: Conjunctivae are normal. PERRL. EOMI. Head: Atraumatic. Nose: No congestion/rhinnorhea. Mouth/Throat: Mucous membranes are moist.  Oropharynx non-erythematous. Whitish discharge noted on tongue and cheeks. Geographic tongue visible. Neck: No stridor.   Cardiovascular: Normal rate, regular rhythm. Grossly normal heart  sounds.  Good peripheral circulation. Respiratory: Normal respiratory effort.  No retractions. Lungs CTAB. Gastrointestinal: Soft and nontender. No distention. No abdominal bruits. No CVA tenderness. Musculoskeletal: No lower extremity tenderness nor edema.  No joint effusions. Neurologic:  Normal speech and language. No gross focal neurologic deficits are appreciated. No gait instability. Skin:  Skin is warm, dry and intact. No rash noted. Psychiatric: Mood and affect are normal. Speech and behavior are normal.  ____________________________________________   LABS (all labs ordered are listed, but only abnormal results are displayed)  Labs Reviewed - No data to display   PROCEDURES  Procedure(s) performed: None  Critical Care performed:  No  ____________________________________________   INITIAL IMPRESSION / ASSESSMENT AND PLAN / ED COURSE  Pertinent labs & imaging results that were available during my care of the patient were reviewed by me and considered in my medical decision making (see chart for details).  Thrush for geographic tongue. Prescribed for Dukes Magic mouthwash with viscous lidocaine. Patient follow-up with PCP or return to the ER if symptoms worsen. ____________________________________________   FINAL CLINICAL IMPRESSION(S) / ED DIAGNOSES  Final diagnoses:  Geographic tongue  Gabriel Earing, PA-C 07/31/15 1904  Phineas Semen, MD 07/31/15 2137

## 2015-09-18 ENCOUNTER — Emergency Department: Payer: Self-pay

## 2015-09-18 ENCOUNTER — Encounter: Payer: Self-pay | Admitting: Emergency Medicine

## 2015-09-18 ENCOUNTER — Inpatient Hospital Stay
Admission: EM | Admit: 2015-09-18 | Discharge: 2015-09-21 | DRG: 728 | Disposition: A | Discharge status: Home / Self Care | Payer: Self-pay | Attending: Internal Medicine | Admitting: Internal Medicine

## 2015-09-18 DIAGNOSIS — B002 Herpesviral gingivostomatitis and pharyngotonsillitis: Secondary | ICD-10-CM

## 2015-09-18 DIAGNOSIS — F17210 Nicotine dependence, cigarettes, uncomplicated: Secondary | ICD-10-CM | POA: Diagnosis present

## 2015-09-18 DIAGNOSIS — N50819 Testicular pain, unspecified: Secondary | ICD-10-CM

## 2015-09-18 DIAGNOSIS — N492 Inflammatory disorders of scrotum: Secondary | ICD-10-CM

## 2015-09-18 DIAGNOSIS — N5082 Scrotal pain: Secondary | ICD-10-CM

## 2015-09-18 DIAGNOSIS — L0390 Cellulitis, unspecified: Secondary | ICD-10-CM

## 2015-09-18 DIAGNOSIS — B20 Human immunodeficiency virus [HIV] disease: Secondary | ICD-10-CM

## 2015-09-18 DIAGNOSIS — B370 Candidal stomatitis: Secondary | ICD-10-CM | POA: Diagnosis present

## 2015-09-18 DIAGNOSIS — Z21 Asymptomatic human immunodeficiency virus [HIV] infection status: Secondary | ICD-10-CM

## 2015-09-18 LAB — URINALYSIS COMPLETE WITH MICROSCOPIC (ARMC ONLY)
Bilirubin Urine: NEGATIVE
GLUCOSE, UA: NEGATIVE mg/dL
Hgb urine dipstick: NEGATIVE
Ketones, ur: NEGATIVE mg/dL
Leukocytes, UA: NEGATIVE
Nitrite: NEGATIVE
PROTEIN: NEGATIVE mg/dL
RBC / HPF: NONE SEEN RBC/hpf (ref 0–5)
SQUAMOUS EPITHELIAL / LPF: NONE SEEN
Specific Gravity, Urine: 1.017 (ref 1.005–1.030)
pH: 6 (ref 5.0–8.0)

## 2015-09-18 LAB — COMPREHENSIVE METABOLIC PANEL
ALT: 32 U/L (ref 17–63)
ANION GAP: 6 (ref 5–15)
AST: 20 U/L (ref 15–41)
Albumin: 3.7 g/dL (ref 3.5–5.0)
Alkaline Phosphatase: 48 U/L (ref 38–126)
BILIRUBIN TOTAL: 0.6 mg/dL (ref 0.3–1.2)
BUN: 13 mg/dL (ref 6–20)
CO2: 26 mmol/L (ref 22–32)
Calcium: 9.2 mg/dL (ref 8.9–10.3)
Chloride: 107 mmol/L (ref 101–111)
Creatinine, Ser: 1.01 mg/dL (ref 0.61–1.24)
GFR calc non Af Amer: >60 mL/min (ref >60)
Glucose, Bld: 97 mg/dL (ref 65–99)
Potassium: 3.5 mmol/L (ref 3.5–5.1)
SODIUM: 139 mmol/L (ref 135–145)
TOTAL PROTEIN: 7.9 g/dL (ref 6.5–8.1)

## 2015-09-18 LAB — CBC WITH DIFFERENTIAL/PLATELET
Basophils Absolute: 0 10*3/uL (ref 0–0.1)
Basophils Relative: 1 %
EOS ABS: 0.1 10*3/uL (ref 0–0.7)
Eosinophils Relative: 2 %
HCT: 38.8 % — ABNORMAL LOW (ref 40.0–52.0)
Hemoglobin: 13 g/dL (ref 13.0–18.0)
Lymphocytes Relative: 18 %
Lymphs Abs: 1.2 10*3/uL (ref 1.0–3.6)
MCH: 30.2 pg (ref 26.0–34.0)
MCHC: 33.6 g/dL (ref 32.0–36.0)
MCV: 90 fL (ref 80.0–100.0)
MONO ABS: 0.7 10*3/uL (ref 0.2–1.0)
MONOS PCT: 10 %
NEUTROS PCT: 69 %
Neutro Abs: 4.7 10*3/uL (ref 1.4–6.5)
Platelets: 234 10*3/uL (ref 150–440)
RBC: 4.31 MIL/uL — ABNORMAL LOW (ref 4.40–5.90)
RDW: 15.8 % — AB (ref 11.5–14.5)
WBC: 6.8 10*3/uL (ref 3.8–10.6)

## 2015-09-18 LAB — CREATININE, SERUM: Creatinine, Ser: 0.9 mg/dL (ref 0.61–1.24)

## 2015-09-18 LAB — RAPID HIV SCREEN (HIV 1/2 AB+AG)
HIV 1/2 ANTIBODIES: REACTIVE — AB
HIV-1 P24 ANTIGEN - HIV24: NONREACTIVE

## 2015-09-18 LAB — TSH: TSH: 1.171 u[IU]/mL (ref 0.350–4.500)

## 2015-09-18 LAB — CHLAMYDIA/NGC RT PCR (ARMC ONLY)
CHLAMYDIA TR: NOT DETECTED
N GONORRHOEAE: NOT DETECTED

## 2015-09-18 LAB — HEMOGLOBIN A1C: Hgb A1c MFr Bld: 5.2 % (ref 4.0–6.0)

## 2015-09-18 MED ORDER — ONDANSETRON HCL 4 MG/2ML IJ SOLN: 4.0000 mg | Freq: Four times a day (QID) | INTRAMUSCULAR | Status: DC | PRN

## 2015-09-18 MED ORDER — HYDROCODONE-ACETAMINOPHEN 5-325 MG PO TABS
1.0000 | ORAL_TABLET | ORAL | Status: DC | PRN
  Administered 2015-09-18: 12:00:00 2 via ORAL
  Administered 2015-09-18: 1 via ORAL
  Filled 2015-09-18 (×2): qty 2

## 2015-09-18 MED ORDER — NYSTATIN-TRIAMCINOLONE 100000-0.1 UNIT/GM-% EX CREA
TOPICAL_CREAM | Freq: Two times a day (BID) | CUTANEOUS | Status: DC
  Administered 2015-09-18: 1 via TOPICAL
  Administered 2015-09-19 – 2015-09-20 (×4): via TOPICAL
  Administered 2015-09-21: 1 via TOPICAL
  Filled 2015-09-18 (×4): qty 15

## 2015-09-18 MED ORDER — ONDANSETRON HCL 4 MG PO TABS: 4.0000 mg | ORAL_TABLET | Freq: Four times a day (QID) | ORAL | Status: DC | PRN

## 2015-09-18 MED ORDER — MORPHINE SULFATE (PF) 4 MG/ML IV SOLN
4.0000 mg | Freq: Once | INTRAVENOUS | Status: AC
  Administered 2015-09-18: 4 mg via INTRAVENOUS
  Filled 2015-09-18: qty 1

## 2015-09-18 MED ORDER — ENOXAPARIN SODIUM 100 MG/ML ~~LOC~~ SOLN: 1.0000 mg/kg | Freq: Two times a day (BID) | SUBCUTANEOUS | Status: DC

## 2015-09-18 MED ORDER — VANCOMYCIN HCL 10 G IV SOLR
1250.0000 mg | Freq: Two times a day (BID) | INTRAVENOUS | Status: DC
  Administered 2015-09-18 – 2015-09-19 (×4): 1250 mg via INTRAVENOUS
  Filled 2015-09-18 (×7): qty 1250

## 2015-09-18 MED ORDER — VANCOMYCIN HCL IN DEXTROSE 1-5 GM/200ML-% IV SOLN
1000.0000 mg | Freq: Once | INTRAVENOUS | Status: DC
  Filled 2015-09-18: qty 200

## 2015-09-18 MED ORDER — DEXTROSE 5 % IV SOLN
1.0000 g | Freq: Once | INTRAVENOUS | Status: AC
  Administered 2015-09-18: 1 g via INTRAVENOUS
  Filled 2015-09-18: qty 10

## 2015-09-18 MED ORDER — MORPHINE SULFATE (PF) 2 MG/ML IV SOLN
2.0000 mg | INTRAVENOUS | Status: DC | PRN
  Administered 2015-09-19 – 2015-09-20 (×2): 2 mg via INTRAVENOUS
  Filled 2015-09-18 (×2): qty 1

## 2015-09-18 MED ORDER — VALACYCLOVIR HCL 500 MG PO TABS
1000.0000 mg | ORAL_TABLET | Freq: Three times a day (TID) | ORAL | Status: DC
  Administered 2015-09-18 – 2015-09-21 (×9): 1000 mg via ORAL
  Filled 2015-09-18 (×10): qty 2

## 2015-09-18 MED ORDER — INFLUENZA VAC SPLIT QUAD 0.5 ML IM SUSY: 0.5000 mL | PREFILLED_SYRINGE | INTRAMUSCULAR | Status: DC

## 2015-09-18 MED ORDER — PIPERACILLIN-TAZOBACTAM 3.375 G IVPB 30 MIN: 3.3750 g | Freq: Once | INTRAVENOUS | Status: DC

## 2015-09-18 MED ORDER — OXYCODONE HCL 5 MG PO TABS
5.0000 mg | ORAL_TABLET | ORAL | Status: DC | PRN
  Administered 2015-09-19 – 2015-09-20 (×5): 5 mg via ORAL
  Filled 2015-09-18 (×5): qty 1

## 2015-09-18 MED ORDER — ADULT MULTIVITAMIN W/MINERALS CH
1.0000 | ORAL_TABLET | Freq: Every day | ORAL | Status: DC
  Administered 2015-09-18 – 2015-09-21 (×4): 1 via ORAL
  Filled 2015-09-18 (×4): qty 1

## 2015-09-18 MED ORDER — FIRST-DUKES MOUTHWASH MT SUSP: 10.0000 mL | Freq: Four times a day (QID) | OROMUCOSAL | Status: DC

## 2015-09-18 MED ORDER — ENOXAPARIN SODIUM 40 MG/0.4ML ~~LOC~~ SOLN
40.0000 mg | SUBCUTANEOUS | Status: DC
  Administered 2015-09-18 – 2015-09-21 (×4): 40 mg via SUBCUTANEOUS
  Filled 2015-09-18 (×4): qty 0.4

## 2015-09-18 MED ORDER — ACETAMINOPHEN 325 MG PO TABS
325.0000 mg | ORAL_TABLET | Freq: Four times a day (QID) | ORAL | Status: DC | PRN
  Administered 2015-09-18 – 2015-09-20 (×2): 650 mg via ORAL
  Filled 2015-09-18 (×2): qty 2

## 2015-09-18 MED ORDER — IBUPROFEN 600 MG PO TABS
600.0000 mg | ORAL_TABLET | ORAL | Status: DC | PRN
  Administered 2015-09-18: 22:00:00 600 mg via ORAL
  Filled 2015-09-18: qty 1

## 2015-09-18 MED ORDER — SODIUM CHLORIDE 0.9 % IV BOLUS (SEPSIS)
1000.0000 mL | Freq: Once | INTRAVENOUS | Status: AC
  Administered 2015-09-18: 1000 mL via INTRAVENOUS

## 2015-09-18 MED ORDER — VANCOMYCIN HCL 10 G IV SOLR
1250.0000 mg | Freq: Once | INTRAVENOUS | Status: DC
  Filled 2015-09-18: qty 1250

## 2015-09-18 MED ORDER — MAGIC MOUTHWASH
10.0000 mL | Freq: Four times a day (QID) | ORAL | Status: DC
  Administered 2015-09-18 – 2015-09-21 (×13): 10 mL via ORAL
  Filled 2015-09-18 (×15): qty 10

## 2015-09-18 MED ORDER — DEXTROSE 5 % IV SOLN
1.0000 g | INTRAVENOUS | Status: DC
  Administered 2015-09-19 – 2015-09-20 (×2): 1 g via INTRAVENOUS
  Filled 2015-09-18 (×3): qty 10

## 2015-09-18 MED ORDER — FLUCONAZOLE IN SODIUM CHLORIDE 200-0.9 MG/100ML-% IV SOLN
200.0000 mg | INTRAVENOUS | Status: DC
  Administered 2015-09-18 – 2015-09-19 (×2): 200 mg via INTRAVENOUS
  Filled 2015-09-18 (×4): qty 100

## 2015-09-18 NOTE — Consult Note (Signed)
Urology Consult  Referring physician: Seth Bake Reason for referral: Scrotal infection  Chief Complaint: Scrotal infection  History of Present Illness: Male patient recent thrush and scrotal pain and swelling; began with itching; newly diagnosed with HIV; has other skin issues noted; started on Vanco and Cetriazone; assessed by ID service; scrotal rash apparently started a few months ago; recently noted ulcers on scrotum;  Difficult to sit due to sore scrotum Previous stone No GU history otherwise Bit harder time to void last few day Modifying factors: There are no other modifying factors  Associated signs and symptoms: There are no other associated signs and symptoms Aggravating and relieving factors: There are no other aggravating or relieving factors Severity: Moderate Duration: Persistent  Past Medical History  Diagnosis Date  . Anxiety    History reviewed. No pertinent past surgical history.  Medications: I have reviewed the patient's current medications. Allergies:  Allergies  Allergen Reactions  . Tramadol Nausea And Vomiting    No family history on file. Social History:  reports that he has been smoking Cigarettes.  He has smoked for the past 26 years. He does not have any smokeless tobacco history on file. He reports that he does not drink alcohol or use illicit drugs.  ROS: All systems are reviewed and negative except as noted. Rest negative  Physical Exam:  Vital signs in last 24 hours: Temp:  [98.2 F (36.8 C)-102.7 F (39.3 C)] 102.7 F (39.3 C) (10/05 1726) Pulse Rate:  [73-104] 77 (10/05 1426) Resp:  [20] 20 (10/05 0645) BP: (110-135)/(71-89) 116/71 mmHg (10/05 1426) SpO2:  [96 %-100 %] 99 % (10/05 1426) Weight:  [84.006 kg (185 lb 3.2 oz)-89.812 kg (198 lb)] 84.006 kg (185 lb 3.2 oz) (10/05 1426)  Cardiovascular: Skin warm; not flushed Respiratory: Breaths quiet; no shortness of breath Abdomen: No masses Neurological: Normal sensation to  touch Musculoskeletal: Normal motor function arms and legs Lymphatics: No inguinal adenopathy Skin: No rashes Genitourinary:Superficial skin ulcers on rt/lt scrotum that are very dry and tender; mild cellulitis of skin; NO DEEP infection and testicles easy to palpate- soft and supple  Laboratory Data:  Results for orders placed or performed during the hospital encounter of 09/18/15 (from the past 72 hour(s))  Urinalysis complete, with microscopic ([facility] only)     Status: Abnormal   Collection Time: 09/18/15  7:27 AM  Result Value Ref Range   Color, Urine YELLOW (A) YELLOW   APPearance CLEAR (A) CLEAR   Glucose, UA NEGATIVE NEGATIVE mg/dL   Bilirubin Urine NEGATIVE NEGATIVE   Ketones, ur NEGATIVE NEGATIVE mg/dL   Specific Gravity, Urine 1.017 1.005 - 1.030   Hgb urine dipstick NEGATIVE NEGATIVE   pH 6.0 5.0 - 8.0   Protein, ur NEGATIVE NEGATIVE mg/dL   Nitrite NEGATIVE NEGATIVE   Leukocytes, UA NEGATIVE NEGATIVE   RBC / HPF NONE SEEN 0 - 5 RBC/hpf   WBC, UA 0-5 0 - 5 WBC/hpf   Bacteria, UA RARE (A) NONE SEEN   Squamous Epithelial / LPF NONE SEEN NONE SEEN   Mucous PRESENT   Chlamydia/NGC rt PCR ([facility] only)     Status: None   Collection Time: 09/18/15  7:27 AM  Result Value Ref Range   Specimen source GC/Chlam URINE, RANDOM    Chlamydia Tr NOT DETECTED NOT DETECTED   N gonorrhoeae NOT DETECTED NOT DETECTED    Comment: (NOTE) 100  This methodology has not been evaluated in pregnant women or in 200  patients with a history of  hysterectomy. 300 400  This methodology will not be performed on patients less than 49  years of age.   Rapid HIV screen (HIV 1/2 Ab+Ag) ([facility] Only)     Status: Abnormal   Collection Time: 09/18/15  7:27 AM  Result Value Ref Range   HIV-1 P24 Antigen - HIV24 NON REACTIVE NON REACTIVE   HIV 1/2 Antibodies Reactive (A) NON REACTIVE   Interpretation (HIV Ag Ab)      A reactive test result means that HIV 1 or HIV 2 antibodies have been detected in the  specimen. The test result is interpreted as Preliminary Positive for HIV 1 and/or HIV 2 antibodies.    Comment: SENT FOR CONFIRMATION NOTIFIED JANIE BOWEN ON 09/18/15 AT 0859 BY QSD   CBC with Differential/Platelet     Status: Abnormal   Collection Time: 09/18/15  7:27 AM  Result Value Ref Range   WBC 6.8 3.8 - 10.6 K/uL   RBC 4.31 (L) 4.40 - 5.90 MIL/uL   Hemoglobin 13.0 13.0 - 18.0 g/dL   HCT 38.8 (L) 40.0 - 52.0 %   MCV 90.0 80.0 - 100.0 fL   MCH 30.2 26.0 - 34.0 pg   MCHC 33.6 32.0 - 36.0 g/dL   RDW 15.8 (H) 11.5 - 14.5 %   Platelets 234 150 - 440 K/uL   Neutrophils Relative % 69 %   Neutro Abs 4.7 1.4 - 6.5 K/uL   Lymphocytes Relative 18 %   Lymphs Abs 1.2 1.0 - 3.6 K/uL   Monocytes Relative 10 %   Monocytes Absolute 0.7 0.2 - 1.0 K/uL   Eosinophils Relative 2 %   Eosinophils Absolute 0.1 0 - 0.7 K/uL   Basophils Relative 1 %   Basophils Absolute 0.0 0 - 0.1 K/uL  Comprehensive metabolic panel     Status: None   Collection Time: 09/18/15  7:27 AM  Result Value Ref Range   Sodium 139 135 - 145 mmol/L   Potassium 3.5 3.5 - 5.1 mmol/L   Chloride 107 101 - 111 mmol/L   CO2 26 22 - 32 mmol/L   Glucose, Bld 97 65 - 99 mg/dL   BUN 13 6 - 20 mg/dL   Creatinine, Ser 1.01 0.61 - 1.24 mg/dL   Calcium 9.2 8.9 - 10.3 mg/dL   Total Protein 7.9 6.5 - 8.1 g/dL   Albumin 3.7 3.5 - 5.0 g/dL   AST 20 15 - 41 U/L   ALT 32 17 - 63 U/L   Alkaline Phosphatase 48 38 - 126 U/L   Total Bilirubin 0.6 0.3 - 1.2 mg/dL   GFR calc non Af Amer >60 >60 mL/min   GFR calc Af Amer >60 >60 mL/min    Comment: (NOTE) The eGFR has been calculated using the CKD EPI equation. This calculation has not been validated in all clinical situations. eGFR's persistently <60 mL/min signify possible Chronic Kidney Disease.    Anion gap 6 5 - 15  Creatinine, serum     Status: None   Collection Time: 09/18/15 10:02 AM  Result Value Ref Range   Creatinine, Ser 0.90 0.61 - 1.24 mg/dL   GFR calc non Af Amer >60  >60 mL/min   GFR calc Af Amer >60 >60 mL/min    Comment: (NOTE) The eGFR has been calculated using the CKD EPI equation. This calculation has not been validated in all clinical situations. eGFR's persistently <60 mL/min signify possible Chronic Kidney Disease.   TSH     Status: None  Collection Time: 09/18/15 10:02 AM  Result Value Ref Range   TSH 1.171 0.350 - 4.500 uIU/mL   Recent Results (from the past 240 hour(s))  Chlamydia/NGC rt PCR ([facility] only)     Status: None   Collection Time: 09/18/15  7:27 AM  Result Value Ref Range Status   Specimen source GC/Chlam URINE, RANDOM  Final   Chlamydia Tr NOT DETECTED NOT DETECTED Final   N gonorrhoeae NOT DETECTED NOT DETECTED Final    Comment: (NOTE) 100  This methodology has not been evaluated in pregnant women or in 200  patients with a history of hysterectomy. 300 400  This methodology will not be performed on patients less than 43  years of age.    Creatinine:  Recent Labs  09/18/15 0727 09/18/15 1002  CREATININE 1.01 0.90    Xrays: See report/chart none  Impression/Assessment:  Diffuse infection/HIV/thrush with superficial scrotal ulcers and erythema I am OK not ordering a scrotal u/sound unless picture changes Agree with IV antibiotics Local cream could help dryness/tenderness Recent elevated temp is LIKELY not from scrotum but possible  Plan:  Urine c/s ordered Agree with iv meds If OK with primary team start hydrocrtisone cream 1 percent bid and or ask Dermatology to recommend Will follow   Lerlene Treadwell A 09/18/2015, 6:03 PM

## 2015-09-18 NOTE — ED Provider Notes (Signed)
CSN: [id number]     Arrival date & time 09/18/15  4098 History   First MD Initiated Contact with Patient 09/18/15 [phone]     Chief Complaint  Patient presents with  . Groin Pain     (Consider location/radiation/quality/duration/timing/severity/associated sxs/prior Treatment) The history is provided by the patient.  Lance Reynolds is a 39 y.o. male history anxiety here presenting with blisters on genitals, oral thrush. Patient noticed rash on his genitals for the last 5 days. States that it started with a blister on his testicles that has been getting larger. He was diagnosed with scabies previously so tried permethrim cream with no relief. Noticed some groin swelling as well. Denies any urethral discharge or urinary symptoms. Denies any abdominal pain. He was seen here several months ago for oral thrush. He was given some Magic mouthwash and improved but came back again. He is sexual active with one male currently. He states that she has no symptoms. He doesn't remember what the last time he was checked for HIV.      Past Medical History  Diagnosis Date  . Anxiety    History reviewed. No pertinent past surgical history. No family history on file. Social History  Substance Use Topics  . Smoking status: Current Every Day Smoker  . Smokeless tobacco: None  . Alcohol Use: No    Review of Systems  Genitourinary: Positive for scrotal swelling.  All other systems reviewed and are negative.     Allergies  Tramadol  Home Medications   Prior to Admission medications   Medication Sig Start Date End Date Taking? Authorizing Provider  acetaminophen (TYLENOL) 325 MG tablet Take 325-650 mg by mouth every 6 (six) hours as needed for mild pain.   Yes Historical Provider, MD  acetaminophen-codeine (TYLENOL #3) 300-30 MG per tablet Take 1-2 tablets by mouth every 4 (four) hours as needed for moderate pain. Patient not taking: Reported on 09/18/2015 07/17/15   Chinita Pester, FNP   Diphenhyd-Hydrocort-Nystatin (FIRST-DUKES MOUTHWASH) SUSP Use as directed 10 mLs in the mouth or throat 4 (four) times daily. Patient not taking: Reported on 09/18/2015 07/31/15   Evangeline Dakin, PA-C  meloxicam (MOBIC) 15 MG tablet Take 1 tablet (15 mg total) by mouth daily as needed for pain. Patient not taking: Reported on 09/18/2015 06/26/15   Renford Dills, NP  oxyCODONE-acetaminophen (ROXICET) 5-325 MG per tablet Take 1 tablet by mouth every 8 (eight) hours as needed for moderate pain or severe pain (Do not drive or operate heavy machinery while taking as can cause drowsiness.). Patient not taking: Reported on 09/18/2015 06/26/15   Renford Dills, NP  permethrin (ELIMITE) 5 % cream Leave on overnight, then wash off in the morning. Repeat in 1 week Patient not taking: Reported on 09/18/2015 07/17/15 07/16/16  Cari B Triplett, FNP   BP 124/76 mmHg  Pulse 73  Temp(Src) 99 F (37.2 C) (Oral)  Resp 20  Ht  (1.854 m)  Wt 198 lb (89.812 kg)  BMI 26.13 kg/m2  SpO2 99% Physical Exam  Constitutional: He is oriented to person, place, and time. He appears well-developed and well-nourished.  HENT:  Head: Normocephalic.  + oral thrush. MM slightly dry   Eyes: Conjunctivae are normal. Pupils are equal, round, and reactive to light.  Neck: Normal range of motion. Neck supple.  Cardiovascular: Normal rate, regular rhythm and normal heart sounds.   Pulmonary/Chest: Effort normal and breath sounds normal. No respiratory distress. He has no wheezes. He has no  rales.  Abdominal: Soft. Bowel sounds are normal. He exhibits no distension. There is no tenderness. There is no rebound.  Genitourinary:  Multiple scrotal ulcers with cellulitis. Mild groin lymphadenopathy. No testicular tenderness   Musculoskeletal: Normal range of motion.  Neurological: He is alert and oriented to person, place, and time.  Skin: Skin is warm and dry.  Psychiatric: He has a normal mood and affect. His behavior is normal.  Judgment and thought content normal.  Nursing note and vitals reviewed.   ED Course  Procedures (including critical care time) Labs Review Labs Reviewed  RAPID HIV SCREEN (HIV 1/2 AB+AG) - Abnormal; Notable for the following:    HIV 1/2 Antibodies Reactive (*)    All other components within normal limits  CBC WITH DIFFERENTIAL/PLATELET - Abnormal; Notable for the following:    RBC 4.31 (*)    HCT 38.8 (*)    RDW 15.8 (*)    All other components within normal limits  CHLAMYDIA/NGC RT PCR ([facility] ONLY)  CULTURE, BLOOD (ROUTINE X 2)  CULTURE, BLOOD (ROUTINE X 2)  COMPREHENSIVE METABOLIC PANEL  URINALYSIS COMPLETEWITH MICROSCOPIC ([facility] ONLY)  HIV 1/2 AB - DIFFERENTIATION  T-HELPER CELLS (CD4) COUNT  RPR  HIV-1 RNA ULTRAQUANT REFLEX TO GENTYP+    Imaging Review US Scrotum  09/18/2015   CLINICAL DATA:  Testicular pain.  EXAM: SCROTAL ULTRASOUND  DOPPLER ULTRASOUND OF THE TESTICLES  TECHNIQUE: Complete ultrasound examination of the testicles, epididymis, and other scrotal structures was performed. Color and spectral Doppler ultrasound were also utilized to evaluate blood flow to the testicles.  COMPARISON:  None.  FINDINGS: Right testicle  Measurements: 3.3 x 1.9 x 1.2 cm. No mass or microlithiasis visualized.  Left testicle  Measurements: 2.9 x 2.1 x 1.3 cm. No mass or microlithiasis visualized.  Right epididymis:  Normal in size and appearance.  Left epididymis:  Normal in size and appearance.  Hydrocele:  None visualized.  Varicocele:  None visualized.  Pulsed Doppler interrogation of both testes demonstrates normal low resistance arterial and venous waveforms bilaterally.  However, scrotal skin is significantly thickened and edematous bilaterally. No definite fluid collection or abscess is noted.  IMPRESSION: No evidence of testicular mass or torsion. Severe bilateral scrotal wall thickening is noted consistent with edema or inflammation.   Electronically Signed   By: Lupita Raider, M.D.   On: 09/18/2015 08:37   [place] Art/ven Flow Abd Pelv Doppler  09/18/2015   CLINICAL DATA:  Testicular pain.  EXAM: SCROTAL ULTRASOUND  DOPPLER ULTRASOUND OF THE TESTICLES  TECHNIQUE: Complete ultrasound examination of the testicles, epididymis, and other scrotal structures was performed. Color and spectral Doppler ultrasound were also utilized to evaluate blood flow to the testicles.  COMPARISON:  None.  FINDINGS: Right testicle  Measurements: 3.3 x 1.9 x 1.2 cm. No mass or microlithiasis visualized.  Left testicle  Measurements: 2.9 x 2.1 x 1.3 cm. No mass or microlithiasis visualized.  Right epididymis:  Normal in size and appearance.  Left epididymis:  Normal in size and appearance.  Hydrocele:  None visualized.  Varicocele:  None visualized.  Pulsed Doppler interrogation of both testes demonstrates normal low resistance arterial and venous waveforms bilaterally.  However, scrotal skin is significantly thickened and edematous bilaterally. No definite fluid collection or abscess is noted.  IMPRESSION: No evidence of testicular mass or torsion. Severe bilateral scrotal wall thickening is noted consistent with edema or inflammation.   Electronically Signed   By: Lupita Raider, M.D.   On:  09/18/2015 08:37   I have personally reviewed and evaluated these images and lab results as part of my medical decision-making.   EKG Interpretation None      MDM   Final diagnoses:  Testicular pain    ARVLE GRABE is a 39 y.o. male here with scrotal cellulitis, oral thrush. Concern for possible STD versus AIDS defining illness. Has low grade temp and slightly tachy as well. Will get labs, Scrotal US, rapid HIV, UA, STD panel.   9:39 AM HIV positive. Likely has AIDS defining illness. Still unable to urinate. WBC nl. But has low grade temp. US showed no torsion or abscess. Consulted Dr. Sampson Goon from ID, who added HIV antibody and quant and will see patient. Recommend vanc and ceftriaxone. Will  admit.      Richardean Canal, MD 09/18/15 [phone]

## 2015-09-18 NOTE — Progress Notes (Addendum)
ANTIBIOTIC CONSULT NOTE - INITIAL  Pharmacy Consult for Vancomycin Indication: cellulitis  Allergies  Allergen Reactions  . Tramadol Nausea And Vomiting    Patient Measurements: Height:  (185.4 cm) Weight: 198 lb (89.812 kg) IBW/kg (Calculated) : 79.9 Adjusted Body Weight: 83.8 kg  Vital Signs: Temp: 99 F (37.2 C) (10/05 0645) Temp Source: Oral (10/05 0645) BP: 110/76 mmHg (10/05 1030) Pulse Rate: 89 (10/05 1030) Intake/Output from previous day:   Intake/Output from this shift:    Labs:  Recent Labs  09/18/15 0727  WBC 6.8  HGB 13.0  PLT 234  CREATININE 1.01   Estimated Creatinine Clearance: 109.9 mL/min (by C-G formula based on Cr of 1.01). No results for input(s): VANCOTROUGH, VANCOPEAK, VANCORANDOM, GENTTROUGH, GENTPEAK, GENTRANDOM, TOBRATROUGH, TOBRAPEAK, TOBRARND, AMIKACINPEAK, AMIKACINTROU, AMIKACIN in the last 72 hours.   Microbiology: No results found for this or any previous visit (from the past 720 hour(s)).  Medical History: Past Medical History  Diagnosis Date  . Anxiety     Medications:  Scheduled:  . enoxaparin (LOVENOX) injection  40 mg Subcutaneous Q24H  . fluconazole (DIFLUCAN) IV  200 mg Intravenous Q24H  . magic mouthwash  10 mL Oral QID  . multivitamin with minerals  1 tablet Oral Daily  . vancomycin  1,250 mg Intravenous Once  . vancomycin  1,250 mg Intravenous Q12H   Assessment: Pharmacy consulted to dose and monitor Vancomycin in a 39 yo male for empiric treatment of cellulitis.  US showing no abscess.  Patient with new diagnosis of HIV and receiving treatment for thrush with Fluconazole 200 mg IV q24h.  ID consulted.   SCr: 1.01, est CrCl~110 mL/min, ke: 0.096, t1/2: 7.2 h, Vd: 58.7 L  BCx: pending x 2, CD4 count pending   Goal of Therapy:  Vancomycin trough level 10-15 mcg/ml  Plan:  Will order Vancomycin 1250 mg IV once with another stacked dose in 9 hours.  Will then start maintenance dosing of Vancomycin 1250 mg  IV q12h.  Will check a trough prior to 4th dose on 10/7 at 0830 (will be at steady state). CBC and BMP ordered in AM.  CD4 count pending. Follow up culture results   Pharmacy will continue to follow.  Clarisa Schools, PharmD Clinical Pharmacist 09/18/2015

## 2015-09-18 NOTE — ED Notes (Signed)
Pt to triage via w/c with no distress noted; pt reports ?blister to genitals noted since last Saturday

## 2015-09-18 NOTE — Consult Note (Signed)
[facility]     Reason for Consult: Scrotal cellulitis, new HIV DX    Referring Physician: Enriqueta Shutter Date of Admission:  09/18/2015   Active Problems:   Cellulitis   HPI: Lance Reynolds is a 39 y.o. male with hx of anxiety who had developed thush as well as scrotal pain and swelling.  He states he has had a weak immune system since starting a new job a few years ago.  He developing a rash on scrotum which he was itching and it progressed to open sores and redness.  He also had a diffuse skin rash and was treated with permetherin cream recently as well.  He also had a cyst on his scalp and was given an abx which made him feel sick and then he developed sores in his mouth and thrush.  He has lost about 10 lbs and fells cold a lot but denies fevers or night sweats. No chronic diarrhera. No dysphagia or odynophagia. No HA, neck stiffness, no cough, cp, abd pain.  Past Medical History  Diagnosis Date  . Anxiety    History reviewed. No pertinent past surgical history. Social History  Substance Use Topics  . Smoking status: Current Some Day Smoker -- 26 years    Types: Cigarettes  . Smokeless tobacco: None  . Alcohol Use: No   No family history on file.  Allergies:  Allergies  Allergen Reactions  . Tramadol Nausea And Vomiting    Current antibiotics: Antibiotics Given (last 72 hours)    None      MEDICATIONS: . enoxaparin (LOVENOX) injection  40 mg Subcutaneous Q24H  . fluconazole (DIFLUCAN) IV  200 mg Intravenous Q24H  . [START ON 09/19/2015] Influenza vac split quadrivalent PF  0.5 mL Intramuscular Tomorrow-1000  . magic mouthwash  10 mL Oral QID  . multivitamin with minerals  1 tablet Oral Daily  . vancomycin  1,250 mg Intravenous Once  . vancomycin  1,250 mg Intravenous Q12H    Review of Systems - 11 systems reviewed and negative per HPI   OBJECTIVE: Temp:  [99 F (37.2 C)] 99 F (37.2 C) (10/05 0645) Pulse Rate:  [73-104] 89 (10/05  1030) Resp:  [20] 20 (10/05 0645) BP: (110-135)/(76-89) 110/76 mmHg (10/05 1030) SpO2:  [96 %-100 %] 99 % (10/05 1030) Weight:  [89.812 kg (198 lb)] 89.812 kg (198 lb) (10/05 0645) Physical Exam  Constitutional: He is oriented to person, place, and time. He appears dishelved, chronically ill appearing HENT: R post forehead with 2 cm cystic mass, mild ttp, not red or inflamed Mouth/Throat: Oropharynx is clear - + thrush, multiple ulcers, erosion at corner of lips No oropharyngeal exudate.  Cardiovascular: Normal rate, regular rhythm and normal heart sounds. Exam reveals no gallop and no friction rub.  Pulmonary/Chest: Effort normal and breath sounds normal. No respiratory distress. He has no wheezes.  Abdominal: Soft. Bowel sounds are normal. He exhibits no distension. There is no tenderness.  GU - mild scrotal swelling, thickened skin, several dry ulcers which are painful Lymphadenopathy:  He has shoddy cervical adenopathy.  Neurological: He is alert and oriented to person, place, and time.  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 His behavior is normal.   LABS: Results for orders placed or performed during the hospital encounter of 09/18/15 (from the past 48 hour(s))  Urinalysis complete, with microscopic [place] only)     Status: Abnormal   Collection Time: 09/18/15  7:27 AM  Result Value Ref Range   Color, Urine YELLOW (A) YELLOW   APPearance CLEAR (A) CLEAR   Glucose, UA NEGATIVE NEGATIVE mg/dL   Bilirubin Urine NEGATIVE NEGATIVE   Ketones, ur NEGATIVE NEGATIVE mg/dL   Specific Gravity, Urine 1.017 1.005 - 1.030   Hgb urine dipstick NEGATIVE NEGATIVE   pH 6.0 5.0 - 8.0   Protein, ur NEGATIVE NEGATIVE mg/dL   Nitrite NEGATIVE NEGATIVE   Leukocytes, UA NEGATIVE NEGATIVE   RBC / HPF NONE SEEN 0 - 5 RBC/hpf   WBC, UA 0-5 0 - 5 WBC/hpf   Bacteria, UA RARE (A) NONE SEEN   Squamous Epithelial / LPF NONE SEEN NONE SEEN   Mucous  PRESENT   Chlamydia/NGC rt PCR ([facility] only)     Status: None   Collection Time: 09/18/15  7:27 AM  Result Value Ref Range   Specimen source GC/Chlam URINE, RANDOM    Chlamydia Tr NOT DETECTED NOT DETECTED   N gonorrhoeae NOT DETECTED NOT DETECTED    Comment: (NOTE) 100  This methodology has not been evaluated in pregnant women or in 200  patients with a history of hysterectomy. 300 400  This methodology will not be performed on patients less than 68  years of age.   Rapid HIV screen (HIV 1/2 Ab+Ag) ([facility] Only)     Status: Abnormal   Collection Time: 09/18/15  7:27 AM  Result Value Ref Range   HIV-1 P24 Antigen - HIV24 NON REACTIVE NON REACTIVE   HIV 1/2 Antibodies Reactive (A) NON REACTIVE   Interpretation (HIV Ag Ab)      A reactive test result means that HIV 1 or HIV 2 antibodies have been detected in the specimen. The test result is interpreted as Preliminary Positive for HIV 1 and/or HIV 2 antibodies.    Comment: SENT FOR CONFIRMATION NOTIFIED JANIE BOWEN ON 09/18/15 AT 0859 BY QSD   CBC with Differential/Platelet     Status: Abnormal   Collection Time: 09/18/15  7:27 AM  Result Value Ref Range   WBC 6.8 3.8 - 10.6 K/uL   RBC 4.31 (L) 4.40 - 5.90 MIL/uL   Hemoglobin 13.0 13.0 - 18.0 g/dL   HCT 38.8 (L) 40.0 - 52.0 %   MCV 90.0 80.0 - 100.0 fL   MCH 30.2 26.0 - 34.0 pg   MCHC 33.6 32.0 - 36.0 g/dL   RDW 15.8 (H) 11.5 - 14.5 %   Platelets 234 150 - 440 K/uL   Neutrophils Relative % 69 %   Neutro Abs 4.7 1.4 - 6.5 K/uL   Lymphocytes Relative 18 %   Lymphs Abs 1.2 1.0 - 3.6 K/uL   Monocytes Relative 10 %   Monocytes Absolute 0.7 0.2 - 1.0 K/uL   Eosinophils Relative 2 %   Eosinophils Absolute 0.1 0 - 0.7 K/uL   Basophils Relative 1 %   Basophils Absolute 0.0 0 - 0.1 K/uL  Comprehensive metabolic panel     Status: None   Collection Time: 09/18/15  7:27 AM  Result Value Ref Range   Sodium 139 135 - 145 mmol/L   Potassium 3.5 3.5 - 5.1 mmol/L   Chloride 107 101 - 111  mmol/L   CO2 26 22 - 32 mmol/L   Glucose, Bld 97 65 - 99 mg/dL   BUN 13 6 - 20 mg/dL   Creatinine, Ser 1.01 0.61 - 1.24 mg/dL   Calcium 9.2 8.9 - 10.3 mg/dL   Total Protein 7.9 6.5 - 8.1  g/dL   Albumin 3.7 3.5 - 5.0 g/dL   AST 20 15 - 41 U/L   ALT 32 17 - 63 U/L   Alkaline Phosphatase 48 38 - 126 U/L   Total Bilirubin 0.6 0.3 - 1.2 mg/dL   GFR calc non Af Amer >60 >60 mL/min   GFR calc Af Amer >60 >60 mL/min    Comment: (NOTE) The eGFR has been calculated using the CKD EPI equation. This calculation has not been validated in all clinical situations. eGFR's persistently <60 mL/min signify possible Chronic Kidney Disease.    Anion gap 6 5 - 15  Creatinine, serum     Status: None   Collection Time: 09/18/15 10:02 AM  Result Value Ref Range   Creatinine, Ser 0.90 0.61 - 1.24 mg/dL   GFR calc non Af Amer >60 >60 mL/min   GFR calc Af Amer >60 >60 mL/min    Comment: (NOTE) The eGFR has been calculated using the CKD EPI equation. This calculation has not been validated in all clinical situations. eGFR's persistently <60 mL/min signify possible Chronic Kidney Disease.   TSH     Status: None   Collection Time: 09/18/15 10:02 AM  Result Value Ref Range   TSH 1.171 0.350 - 4.500 uIU/mL   No components found for: ESR, C REACTIVE PROTEIN MICRO: Recent Results (from the past 720 hour(s))  Chlamydia/NGC rt PCR ([facility] only)     Status: None   Collection Time: 09/18/15  7:27 AM  Result Value Ref Range Status   Specimen source GC/Chlam URINE, RANDOM  Final   Chlamydia Tr NOT DETECTED NOT DETECTED Final   N gonorrhoeae NOT DETECTED NOT DETECTED Final    Comment: (NOTE) 100  This methodology has not been evaluated in pregnant women or in 200  patients with a history of hysterectomy. 300 400  This methodology will not be performed on patients less than 60  years of age.     IMAGING: Dg Chest 2 View  09/18/2015   CLINICAL DATA:  39 year old male with soft tissue lesions on scrotum.  Fever an generalized weakness for 3 days. Initial encounter.  EXAM: CHEST  2 VIEW  COMPARISON:  Chest radiographs 10/18/2014 and earlier.  FINDINGS: Mildly lower lung volumes. Mild crowding of lung markings. Mediastinal contours remain normal. Visualized tracheal air column is within normal limits. No pneumothorax, pulmonary edema, pleural effusion or confluent pulmonary opacity. No acute osseous abnormality identified. Negative visible bowel gas pattern.  IMPRESSION: No acute cardiopulmonary abnormality.   Electronically Signed   By: Genevie Ann M.D.   On: 09/18/2015 10:00   US Scrotum  09/18/2015   CLINICAL DATA:  Testicular pain.  EXAM: SCROTAL ULTRASOUND  DOPPLER ULTRASOUND OF THE TESTICLES  TECHNIQUE: Complete ultrasound examination of the testicles, epididymis, and other scrotal structures was performed. Color and spectral Doppler ultrasound were also utilized to evaluate blood flow to the testicles.  COMPARISON:  None.  FINDINGS: Right testicle  Measurements: 3.3 x 1.9 x 1.2 cm. No mass or microlithiasis visualized.  Left testicle  Measurements: 2.9 x 2.1 x 1.3 cm. No mass or microlithiasis visualized.  Right epididymis:  Normal in size and appearance.  Left epididymis:  Normal in size and appearance.  Hydrocele:  None visualized.  Varicocele:  None visualized.  Pulsed Doppler interrogation of both testes demonstrates normal low resistance arterial and venous waveforms bilaterally.  However, scrotal skin is significantly thickened and edematous bilaterally. No definite fluid collection or abscess is noted.  IMPRESSION: No  evidence of testicular mass or torsion. Severe bilateral scrotal wall thickening is noted consistent with edema or inflammation.   Electronically Signed   By: Marijo Conception, M.D.   On: 09/18/2015 08:37   [place] Art/ven Flow Abd Pelv Doppler  09/18/2015   CLINICAL DATA:  Testicular pain.  EXAM: SCROTAL ULTRASOUND  DOPPLER ULTRASOUND OF THE TESTICLES  TECHNIQUE: Complete ultrasound examination  of the testicles, epididymis, and other scrotal structures was performed. Color and spectral Doppler ultrasound were also utilized to evaluate blood flow to the testicles.  COMPARISON:  None.  FINDINGS: Right testicle  Measurements: 3.3 x 1.9 x 1.2 cm. No mass or microlithiasis visualized.  Left testicle  Measurements: 2.9 x 2.1 x 1.3 cm. No mass or microlithiasis visualized.  Right epididymis:  Normal in size and appearance.  Left epididymis:  Normal in size and appearance.  Hydrocele:  None visualized.  Varicocele:  None visualized.  Pulsed Doppler interrogation of both testes demonstrates normal low resistance arterial and venous waveforms bilaterally.  However, scrotal skin is significantly thickened and edematous bilaterally. No definite fluid collection or abscess is noted.  IMPRESSION: No evidence of testicular mass or torsion. Severe bilateral scrotal wall thickening is noted consistent with edema or inflammation.   Electronically Signed   By: Marijo Conception, M.D.   On: 09/18/2015 08:37    Assessment:   Lance Reynolds is a 39 y.o. 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 He has prurigo nodularis skin lesions, shoddy cervical LAN and a cystic lesion on his forehead.  Has lost 10#s Likely has low CD4.   Recommendations Scrotal cellulitis- cont ceftriaxone and vanco - add valtrex for the ulcers which are probably herpetic - they are dry so unlikely to be PCR positive but will check HSV serology  HIV - new dx - I have contacted [place]. Encouraged him to disclose to his fiance - check CD4, VL, genotype, RPR, Hep serologies  Thrush - fluconazole   Scalp cyst - atypical appearance - will need bxp- does not seem to be Kaposis however. Thank you very much for allowing me to participate in the care of this patient. Please call with questions.   Cheral Marker. Ola Spurr, MD

## 2015-09-18 NOTE — H&P (Signed)
[facility] [place] [facility]   PATIENT NAME: Lance Reynolds    MR#:  [id number]  DATE OF BIRTH:  Feb 26, 1976  DATE OF ADMISSION:  09/18/2015  PRIMARY CARE PHYSICIAN: No PCP Per Patient   REQUESTING/REFERRING PHYSICIAN:   CHIEF COMPLAINT:   Chief Complaint  Patient presents with  . Groin Pain    HISTORY OF PRESENT ILLNESS: Lance Reynolds  is a 39 y.o. male with a known history anxiety and thrush, which was recently diagnosed and treated with Magic mouthwash presented to the hospital with complaints of rash on his genitals. When the patient, he noted rash couple of months ago, it was just "dry skin" according to patient and because of concerns of dry skin, he used the baby lotion, with improvement of his condition. However, approximately 5 days ago on Saturday he started noticing the rash coming back. It was erythematous rash in the area of scrotum with bilateral scrotal ulcerations, not draining. She has no drainage from his penis. Admits of feeling cold and gets very tired. It is of having some increasing pain in the genital area, inability to walk or sit down. He complains of a groin area pain. On arrival to emergency room, he was also noted to have thrush, recurrent in his mouth with mouth ulcerations, largest one was swelling in left lip corner. Patient tells me that he is not able to eat well because he is not able to open his mouth due to pain. Patient had HIV testing in emergency room, which was positive and hospitalist services were contacted for admission. Patient according to  Emergency room physician "does not date  women".   PAST MEDICAL HISTORY:   Past Medical History  Diagnosis Date  . Anxiety     PAST SURGICAL HISTORY: History reviewed. No pertinent past surgical history.  SOCIAL HISTORY:  Social History  Substance Use Topics  . Smoking status: Current Every Day Smoker  . Smokeless tobacco: Not on file  . Alcohol Use: No    FAMILY  HISTORY: No family history on file.  DRUG ALLERGIES:  Allergies  Allergen Reactions  . Tramadol Nausea And Vomiting    Review of Systems  Constitutional: Positive for chills and malaise/fatigue. Negative for fever and weight loss.  HENT: Positive for congestion.   Eyes: Negative for blurred vision and double vision.  Respiratory: Positive for cough. Negative for sputum production, shortness of breath and wheezing.   Cardiovascular: Negative for chest pain, palpitations, orthopnea, leg swelling and PND.  Gastrointestinal: Negative for nausea, vomiting, abdominal pain, diarrhea, constipation, blood in stool and melena.  Genitourinary: Negative for dysuria, urgency, frequency and hematuria.  Musculoskeletal: Negative for falls.  Skin: Positive for rash.  Neurological: Positive for weakness. Negative for dizziness.  Psychiatric/Behavioral: Negative for depression and memory loss. The patient is nervous/anxious.     MEDICATIONS AT HOME:  Prior to Admission medications   Medication Sig Start Date End Date Taking? Authorizing Provider  acetaminophen (TYLENOL) 325 MG tablet Take 325-650 mg by mouth every 6 (six) hours as needed for mild pain.   Yes Historical Provider, MD  acetaminophen-codeine (TYLENOL #3) 300-30 MG per tablet Take 1-2 tablets by mouth every 4 (four) hours as needed for moderate pain. Patient not taking: Reported on 09/18/2015 07/17/15   Chinita Pester, FNP  Diphenhyd-Hydrocort-Nystatin (FIRST-DUKES MOUTHWASH) SUSP Use as directed 10 mLs in the mouth or throat 4 (four) times daily. Patient not taking: Reported on 09/18/2015 07/31/15   Evangeline Dakin,  PA-C  meloxicam (MOBIC) 15 MG tablet Take 1 tablet (15 mg total) by mouth daily as needed for pain. Patient not taking: Reported on 09/18/2015 06/26/15   Renford Dills, NP  oxyCODONE-acetaminophen (ROXICET) 5-325 MG per tablet Take 1 tablet by mouth every 8 (eight) hours as needed for moderate pain or severe pain (Do not drive or  operate heavy machinery while taking as can cause drowsiness.). Patient not taking: Reported on 09/18/2015 06/26/15   Renford Dills, NP  permethrin (ELIMITE) 5 % cream Leave on overnight, then wash off in the morning. Repeat in 1 week Patient not taking: Reported on 09/18/2015 07/17/15 07/16/16  Chinita Pester, FNP      PHYSICAL EXAMINATION:   VITAL SIGNS: Blood pressure 124/76, pulse 73, temperature 99 F (37.2 C), temperature source Oral, resp. rate 20, height 6\' 1"  (1.854 m), weight 89.812 kg (198 lb), SpO2 99 %.  GENERAL:  39 y.o.-year-old patient lying in the bed with no acute distress. Significant whitish greenish discharge in the mouth with left lip corner sore, swelling, no other aphthous changes where noted in the mouth. Lymphadenopathy in the  submandibular area, not painful to palpation EYES: Pupils equal, round, reactive to light and accommodation. No scleral icterus. Extraocular muscles intact.  HEENT: Head atraumatic, normocephalic. Oropharynx and nasopharynx clear.  NECK:  Supple, no jugular venous distention. No thyroid enlargement, no tenderness.  LUNGS: Somewhat diminished breath sounds bilaterally, but no wheezing, rales,rhonchi or crepitation. No use of accessory muscles of respiration.  CARDIOVASCULAR: S1, S2 normal. No murmurs, rubs, or gallops.  ABDOMEN: Soft, nontender, nondistended. Bowel sounds present. No organomegaly or mass. Bilateral groin tenderness but no lymphadenopathy in the groin area. Thickened cord was felt which is painful to palpation, especially on the right.  EXTREMITIES: No pedal edema, cyanosis, or clubbing.  NEUROLOGIC: Cranial nerves II through XII are intact. Muscle strength 5/5 in all extremities. Sensation intact. Gait not checked.  PSYCHIATRIC: The patient is alert and oriented x 3.  SKIN: No rash or shallow scrotal ulcers, painful to palpation, erythematous scrotum, some swelling in the posterior testicular area, no penile involvement and no penile  discharge. Macula rash in upper extremities from elbows down to the fingertips, as well as lower extremities around patient's feet  LABORATORY PANEL:   CBC  Recent Labs Lab 09/18/15 0727  WBC 6.8  HGB 13.0  HCT 38.8*  PLT 234  MCV 90.0  MCH 30.2  MCHC 33.6  RDW 15.8*  LYMPHSABS 1.2  MONOABS 0.7  EOSABS 0.1  BASOSABS 0.0   ------------------------------------------------------------------------------------------------------------------  Chemistries   Recent Labs Lab 09/18/15 0727  NA 139  K 3.5  CL 107  CO2 26  GLUCOSE 97  BUN 13  CREATININE 1.01  CALCIUM 9.2  AST 20  ALT 32  ALKPHOS 48  BILITOT 0.6   ------------------------------------------------------------------------------------------------------------------  Cardiac Enzymes No results for input(s): TROPONINI in the last 168 hours. ------------------------------------------------------------------------------------------------------------------  RADIOLOGY: Dg Chest 2 View  09/18/2015   CLINICAL DATA:  39 year old male with soft tissue lesions on scrotum. Fever an generalized weakness for 3 days. Initial encounter.  EXAM: CHEST  2 VIEW  COMPARISON:  Chest radiographs 10/18/2014 and earlier.  FINDINGS: Mildly lower lung volumes. Mild crowding of lung markings. Mediastinal contours remain normal. Visualized tracheal air column is within normal limits. No pneumothorax, pulmonary edema, pleural effusion or confluent pulmonary opacity. No acute osseous abnormality identified. Negative visible bowel gas pattern.  IMPRESSION: No acute cardiopulmonary abnormality.   Electronically Signed   By:  Odessa Fleming M.D.   On: 09/18/2015 10:00   US Scrotum  09/18/2015   CLINICAL DATA:  Testicular pain.  EXAM: SCROTAL ULTRASOUND  DOPPLER ULTRASOUND OF THE TESTICLES  TECHNIQUE: Complete ultrasound examination of the testicles, epididymis, and other scrotal structures was performed. Color and spectral Doppler ultrasound were also  utilized to evaluate blood flow to the testicles.  COMPARISON:  None.  FINDINGS: Right testicle  Measurements: 3.3 x 1.9 x 1.2 cm. No mass or microlithiasis visualized.  Left testicle  Measurements: 2.9 x 2.1 x 1.3 cm. No mass or microlithiasis visualized.  Right epididymis:  Normal in size and appearance.  Left epididymis:  Normal in size and appearance.  Hydrocele:  None visualized.  Varicocele:  None visualized.  Pulsed Doppler interrogation of both testes demonstrates normal low resistance arterial and venous waveforms bilaterally.  However, scrotal skin is significantly thickened and edematous bilaterally. No definite fluid collection or abscess is noted.  IMPRESSION: No evidence of testicular mass or torsion. Severe bilateral scrotal wall thickening is noted consistent with edema or inflammation.   Electronically Signed   By: Lupita Raider, M.D.   On: 09/18/2015 08:37   [place] Art/ven Flow Abd Pelv Doppler  09/18/2015   CLINICAL DATA:  Testicular pain.  EXAM: SCROTAL ULTRASOUND  DOPPLER ULTRASOUND OF THE TESTICLES  TECHNIQUE: Complete ultrasound examination of the testicles, epididymis, and other scrotal structures was performed. Color and spectral Doppler ultrasound were also utilized to evaluate blood flow to the testicles.  COMPARISON:  None.  FINDINGS: Right testicle  Measurements: 3.3 x 1.9 x 1.2 cm. No mass or microlithiasis visualized.  Left testicle  Measurements: 2.9 x 2.1 x 1.3 cm. No mass or microlithiasis visualized.  Right epididymis:  Normal in size and appearance.  Left epididymis:  Normal in size and appearance.  Hydrocele:  None visualized.  Varicocele:  None visualized.  Pulsed Doppler interrogation of both testes demonstrates normal low resistance arterial and venous waveforms bilaterally.  However, scrotal skin is significantly thickened and edematous bilaterally. No definite fluid collection or abscess is noted.  IMPRESSION: No evidence of testicular mass or torsion. Severe bilateral  scrotal wall thickening is noted consistent with edema or inflammation.   Electronically Signed   By: Lupita Raider, M.D.   On: 09/18/2015 08:37    EKG: Orders placed or performed in visit on 03/07/10  . EKG 12-Lead    IMPRESSION AND PLAN:  Active Problems:   Cellulitis 1. Scrotal cellulitis, admitted to medical floor, continue him on Rocephin as well as vancomycin per infectious disease recommendations. Get ID as well as urologist consultation. 2. Thrush initiate the Diflucan 3. Dysphagia. Initiate soft diet 4. HIV,  getting ID consultation for further recommendations   All the records are reviewed and case discussed with ED provider. Management plans discussed with the patient, family and they are in agreement.  CODE STATUS: Full code   TOTAL TIME TAKING CARE OF THIS PATIENT: 55 minutes.    Katharina Caper M.D on 09/18/2015 at 10:34 AM  Between 7am to 6pm - Pager - [phone] After 6pm go to [email or web address] - password [email or web address] [facility]  [facility]  Office  [phone]  CC: Primary care physician; No PCP Per Patient

## 2015-09-18 NOTE — Evaluation (Signed)
Physical Therapy Evaluation Patient Details Name: Lance Reynolds MRN: [id number] DOB: 1976/07/19 Today's Date: 09/18/2015   History of Present Illness  Patient admitted with rash on genitals and thrush, found to be HIV positive in ER. Patient with very painful testicular region secondary to swelling/inflammation.   Clinical Impression  Patient states he has been ambulating short distances without AD recently, however this has been quite painful for him. Patient displays very wide BOS with minimal step lengths initially indicating a very high falls risk, provided with RW and displayed improved step length and no loss of balance noted. Patient very painful and sensitive at all times, particularly with mobility impacting his balance and ambulation tolerance. He does not show any obvious weakness throughout this exam, but is certainly decreased from his baseline balance skills secondary to pain altering his gait pattern. Patient would benefit from use of a RW and HHPT to address his balance deficits. Skilled PT services are indicated to address the above deficits.     Follow Up Recommendations Home health PT    Equipment Recommendations  Rolling walker with 5" wheels    Recommendations for Other Services       Precautions / Restrictions Precautions Precautions: Fall Restrictions Weight Bearing Restrictions: No      Mobility  Bed Mobility Overal bed mobility: Independent             General bed mobility comments: Delayed timing as it is painful, otherwise WFL.   Transfers Overall transfer level: Modified independent Equipment used: Rolling walker (2 wheeled)             General transfer comment: Patient initially off balance when standing with no AD, provided RW.   Ambulation/Gait Ambulation/Gait assistance: Modified independent (Device/Increase time) Ambulation Distance (Feet): 90 Feet Assistive device: Rolling walker (2 wheeled) Gait Pattern/deviations:  Antalgic;Decreased step length - right;Decreased step length - left;Wide base of support   Gait velocity interpretation: Below normal speed for age/gender General Gait Details: Patient initially began to ambulate without AD, appeared very painful, short step lengths with wide BOS indicating very high falls risk. Provided RW, and cuing for appropriate use. No loss of balance noted, though very slow secondary to pain.   Stairs            Wheelchair Mobility    Modified Rankin (Stroke Patients Only)       Balance Overall balance assessment: Needs assistance Sitting-balance support: No upper extremity supported Sitting balance-Leahy Scale: Normal     Standing balance support: Bilateral upper extremity supported Standing balance-Leahy Scale: Fair Standing balance comment: Initially upon standing patient is very off balance as well as with first few steps, with RW no loss of balance or drifting noted. Decreased gait speed secondary to pain, not weakness per PT observation.                              Pertinent Vitals/Pain Pain Assessment:  (Patient in significant pain around genitals, limited mobility to household distance and returned to bed. RN aware and will administer pain meds at next available time. )    Home Living Family/patient expects to be discharged to:: Private residence Living Arrangements: Spouse/significant other;Children Available Help at Discharge: Family (Kids or spouse will be home throughout the day) Type of Home: House Home Access: Stairs to enter Entrance Stairs-Rails: Can reach both Entrance Stairs-Number of Steps: 4   Home Equipment: Cane - single point  Prior Function Level of Independence: Independent         Comments: Patient has had 2 falls in the past year, he occasionally uses a SPC if painful/weak.      Hand Dominance        Extremity/Trunk Assessment   Upper Extremity Assessment: Overall WFL for tasks assessed            Lower Extremity Assessment: Overall WFL for tasks assessed         Communication   Communication: No difficulties  Cognition Arousal/Alertness: Awake/alert Behavior During Therapy: Agitated;WFL for tasks assessed/performed Overall Cognitive Status: Within Functional Limits for tasks assessed                      General Comments General comments (skin integrity, edema, etc.): Patient very flushed in the face before and after ambulation, appears to be pain related.     Exercises        Assessment/Plan    PT Assessment Patient needs continued PT services  PT Diagnosis Difficulty walking   PT Problem List Decreased strength;Decreased activity tolerance;Decreased balance;Decreased knowledge of use of DME;Pain;Decreased safety awareness  PT Treatment Interventions DME instruction;Therapeutic activities;Therapeutic exercise;Gait training;Balance training;Stair training   PT Goals (Current goals can be found in the Care Plan section) Acute Rehab PT Goals Patient Stated Goal: To return home and decrease pain  PT Goal Formulation: With patient Time For Goal Achievement: 10/02/15 Potential to Achieve Goals: Good    Frequency Min 2X/week   Barriers to discharge Decreased caregiver support Patient would benefit from assistance with household maintenance and clearing his floors for RW use.     Co-evaluation               End of Session Equipment Utilized During Treatment: Gait belt Activity Tolerance: Patient tolerated treatment well;Patient limited by pain Patient left: in bed;with call bell/phone within reach;with family/visitor present;with bed alarm set (MD in room) Nurse Communication: Mobility status         Time: 4098-1191 PT Time Calculation (min) (ACUTE ONLY): 14 min   Charges:   PT Evaluation $Initial PT Evaluation Tier I: 1 Procedure     PT G Codes:       Kerin Ransom, PT, DPT    09/18/2015, 4:18 PM

## 2015-09-18 NOTE — Plan of Care (Signed)
Problem: Discharge Progression Outcomes Goal: Barriers To Progression Addressed/Resolved 1. Pt prefers to be called Lance Reynolds 2. Works as a Sports administrator. Placed on High Fall Risks 09/18/15 due to 2 falls within 6 months; perform hourly rounding; individual elimination schedule including urinal at bedside 4. Cellulitis of genitals blistered areas dried and crusted on admission 09/18/15 also has scalp cyst on right side of head; thrush in mouth; administer medications as ordered, monitor VSS and treat pt's pain 5. Medical hx includes anxiety, does not take any medication at home.

## 2015-09-19 DIAGNOSIS — N5082 Scrotal pain: Secondary | ICD-10-CM

## 2015-09-19 DIAGNOSIS — B370 Candidal stomatitis: Secondary | ICD-10-CM

## 2015-09-19 DIAGNOSIS — Z21 Asymptomatic human immunodeficiency virus [HIV] infection status: Secondary | ICD-10-CM

## 2015-09-19 DIAGNOSIS — N492 Inflammatory disorders of scrotum: Secondary | ICD-10-CM

## 2015-09-19 LAB — HIV 1/2 AB DIFFERENTIATION
HIV 1 AB: POSITIVE — AB
HIV 2 Ab: NEGATIVE

## 2015-09-19 LAB — CBC
HCT: 34.9 % — ABNORMAL LOW (ref 40.0–52.0)
HEMOGLOBIN: 11.7 g/dL — AB (ref 13.0–18.0)
MCH: 30.3 pg (ref 26.0–34.0)
MCHC: 33.5 g/dL (ref 32.0–36.0)
MCV: 90.4 fL (ref 80.0–100.0)
PLATELETS: 223 10*3/uL (ref 150–440)
RBC: 3.86 MIL/uL — AB (ref 4.40–5.90)
RDW: 15.5 % — ABNORMAL HIGH (ref 11.5–14.5)
WBC: 7.1 10*3/uL (ref 3.8–10.6)

## 2015-09-19 LAB — BASIC METABOLIC PANEL
ANION GAP: 8 (ref 5–15)
BUN: 13 mg/dL (ref 6–20)
CHLORIDE: 106 mmol/L (ref 101–111)
CO2: 25 mmol/L (ref 22–32)
CREATININE: 1.13 mg/dL (ref 0.61–1.24)
Calcium: 8.6 mg/dL — ABNORMAL LOW (ref 8.9–10.3)
GFR calc non Af Amer: >60 mL/min (ref >60)
Glucose, Bld: 101 mg/dL — ABNORMAL HIGH (ref 65–99)
Potassium: 3.1 mmol/L — ABNORMAL LOW (ref 3.5–5.1)
SODIUM: 139 mmol/L (ref 135–145)

## 2015-09-19 LAB — URINE CULTURE: CULTURE: NO GROWTH

## 2015-09-19 LAB — HIV ANTIBODY (ROUTINE TESTING W REFLEX)

## 2015-09-19 MED ORDER — MAGIC MOUTHWASH W/LIDOCAINE
10.0000 mL | ORAL | Status: DC | PRN
  Filled 2015-09-19 (×2): qty 10

## 2015-09-19 MED ORDER — KCL IN DEXTROSE-NACL 20-5-0.45 MEQ/L-%-% IV SOLN
INTRAVENOUS | Status: DC
  Administered 2015-09-19 – 2015-09-20 (×3): via INTRAVENOUS
  Filled 2015-09-19 (×6): qty 1000

## 2015-09-19 MED ORDER — BOOST / RESOURCE BREEZE PO LIQD
1.0000 | Freq: Three times a day (TID) | ORAL | Status: DC
Start: 2015-09-19 — End: 2015-09-21
  Administered 2015-09-19 – 2015-09-21 (×3): 1 via ORAL

## 2015-09-19 MED ORDER — SALINE SPRAY 0.65 % NA SOLN
1.0000 | NASAL | Status: DC | PRN
  Administered 2015-09-19 (×2): 1 via NASAL
  Filled 2015-09-19: qty 44

## 2015-09-19 NOTE — Progress Notes (Signed)
CSW consult, however patient has no CSW needs at this time.  CSW spoke with RN Care Manager, Patient is followed by Care Management for home health PT and Rx concerns.     CSW signing off, please consult if CSW needs arise.   Sammuel Hines. Theresia Majors, MSW Clinical Social Work Department Emergency Room [phone] 10:08 AM

## 2015-09-19 NOTE — Plan of Care (Signed)
Problem: Discharge Progression Outcomes Goal: Activity appropriate for discharge plan Outcome: Progressing Plan of Care Progress to Goal:   Pt was lethargic during shift and febrile. Paged and spoke with Dr. Clint Guy about pt fever of 102. Med were ordered and administered. Pt BP is low this am but afebrile. No other signs of distress noted. Will continue to monitor.

## 2015-09-19 NOTE — Progress Notes (Signed)
Initial Nutrition Assessment   INTERVENTION:   Meals and Snacks: Cater to patient preferences Medical Food Supplement Therapy: will recommend Boost Breeze po TID, each supplement provides 250 kcal and 9 grams of protein. Pt does not want to drink anything 'milky' right now because of his mouth.   NUTRITION DIAGNOSIS:   Inadequate oral intake related to mouth pain as evidenced by per patient/family report.  GOAL:   Patient will meet greater than or equal to 90% of their needs  MONITOR:    (Energy Intake, Anthropometrics, glucose Profile, Electrolyte and renal Profile)  REASON FOR ASSESSMENT:   Consult Assessment of nutrition requirement/status  ASSESSMENT:   Pt admitted with thrush and scrotal pain with swelling. Pt with new diagnosis of HIV.  Past Medical History  Diagnosis Date  . Anxiety     Diet Order:  Diet full liquid Room service appropriate?: Yes; Fluid consistency:: Thin    Current Nutrition: Pt reports having soup for lunch. Per RN Denny Peon pt attempted to eat eggs this am but could not due to mouth pain; diet has been downgraded to [facility].  Food/Nutrition-Related History: Pt reports ' I was eating everything in sight' but upon more questioning pt reports poor appetite for the past 3 weeks as mouth pain has increased. Pt reports it was gradually becoming less and less, especially the last few days to week.   Medications: MVI  Electrolyte/Renal Profile and Glucose Profile:   Recent Labs Lab 09/18/15 0727 09/18/15 1002 09/19/15 0415  NA 139  --  139  K 3.5  --  3.1*  CL 107  --  106  CO2 26  --  25  BUN 13  --  13  CREATININE 1.01 0.90 1.13  CALCIUM 9.2  --  8.6*  GLUCOSE 97  --  101*   Protein Profile:   Recent Labs Lab 09/18/15 0727  ALBUMIN 3.7    Gastrointestinal Profile: Last BM:  09/17/2015   Nutrition-Focused Physical Exam Findings: Nutrition-Focused physical exam completed. Findings are no fat depletion, no muscle depletion, and no edema.       Weight Change: Pt reports weight loss PTA as his UBW of 198lbs. Per [facility] weight loss of 7% in 3 months.   Height:   Ht Readings from Last 1 Encounters:  09/18/15  (1.854 m)    Weight:   Wt Readings from Last 1 Encounters:  09/18/15 185 lb 3.2 oz (84.006 kg)    Wt Readings from Last 10 Encounters:  09/18/15 185 lb 3.2 oz (84.006 kg)  07/17/15 190 lb (86.183 kg)  06/26/15 198 lb (89.812 kg)    BMI:  Body mass index is 24.44 kg/(m^2).  Estimated Nutritional Needs:   Kcal:  BEE: 1798kcals, TEE: (IF 1.1-1.3)(AF 1.2) 2375-2806kcals  Protein:  67-84g protein (0.8-1.0g/kg)  Fluid:  2100-2550mL of fluid (25-66mL/kg)  EDUCATION NEEDS:   Education needs no appropriate at this time    MODERATE Care Level  Leda Quail, RD, LDN Pager [phone]

## 2015-09-19 NOTE — Plan of Care (Addendum)
Problem: Discharge Progression Outcomes Goal: Home Health Care arrangements in place Outcome: Progressing Plan of care progress to goal: Pt on a full liquid diet, tolerating fairly. C/o pain, oxycodone given PRN. Pt stated relief. Oral care done.  Diflucan IV. IV ABX infusing. Pt has family at the bedside.

## 2015-09-19 NOTE — Progress Notes (Signed)
[facility] [place] at [facility]   PATIENT NAME: Lance Reynolds    MR#:  [id number]  DATE OF BIRTH:  03/08/1976  SUBJECTIVE:  CHIEF COMPLAINT:   Chief Complaint  Patient presents with  . Groin Pain   the patient is 39 year old Caucasian male with past medical history significant for history of thrush in the past who presents to the hospital with erythema of the scrotum and ulcerations of scrotal skin, as well as periorbital area. Lips and mouth, he was diagnosed with cellulitis of scrotum as well as herpetic rash, was initiated on the Rocephin and vancomycin for cellulitis, as well as Diflucan and Valtrex for thrush and hepatitic lesions. 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 Infectious disease consultation is obtained, labs are pending  Review of Systems  Constitutional: Negative for fever, chills and weight loss.  HENT: Negative for congestion.   Eyes: Negative for blurred vision and double vision.  Respiratory: Negative for cough, sputum production, shortness of breath and wheezing.   Cardiovascular: Negative for chest pain, palpitations, orthopnea, leg swelling and PND.  Gastrointestinal: Negative for nausea, vomiting, abdominal pain, diarrhea, constipation and blood in stool.  Genitourinary: Negative for dysuria, urgency, frequency and hematuria.  Musculoskeletal: Negative for falls.  Neurological: Negative for dizziness, tremors, focal weakness and headaches.  Endo/Heme/Allergies: Does not bruise/bleed easily.  Psychiatric/Behavioral: Negative for depression. The patient does not have insomnia.    Patient complains of severe scrotal pain with movements of walking, inability to eat solid food VITAL SIGNS: Blood pressure 113/70, pulse 95, temperature 99.5 F (37.5 C), temperature source Axillary, resp. rate 20, height  (1.854 m), weight 84.006 kg (185 lb 3.2 oz), SpO2 96 %.  PHYSICAL EXAMINATION:   GENERAL:  39 y.o.-year-old patient lying in the bed with no  acute distress. Lip angle ulcerations, swelling, concerning for hepatic lesions, oral thrush EYES: Pupils equal, round, reactive to light and accommodation. No scleral icterus. Extraocular muscles intact.  HEENT: Head atraumatic, normocephalic. Oropharynx and nasopharynx clear.  NECK:  Supple, no jugular venous distention. No thyroid enlargement, no tenderness.  LUNGS: Normal breath sounds bilaterally, no wheezing, rales,rhonchi or crepitation. No use of accessory muscles of respiration.  CARDIOVASCULAR: S1, S2 normal. No murmurs, rubs, or gallops.  ABDOMEN: Soft, nontender, nondistended. Bowel sounds present. No organomegaly or mass.  EXTREMITIES: No pedal edema, cyanosis, or clubbing. Scrotal edema and erythema with superficial ulcerations and yellow slough NEUROLOGIC: Cranial nerves II through XII are intact. Muscle strength 5/5 in all extremities. Sensation intact. Gait not checked.  PSYCHIATRIC: The patient is alert and oriented x 3.  SKIN: No obvious rash, lesion, or ulcer.   ORDERS/RESULTS REVIEWED:   CBC  Recent Labs Lab 09/18/15 0727 09/19/15 0415  WBC 6.8 7.1  HGB 13.0 11.7*  HCT 38.8* 34.9*  PLT 234 223  MCV 90.0 90.4  MCH 30.2 30.3  MCHC 33.6 33.5  RDW 15.8* 15.5*  LYMPHSABS 1.2  --   MONOABS 0.7  --   EOSABS 0.1  --   BASOSABS 0.0  --    ------------------------------------------------------------------------------------------------------------------  Chemistries   Recent Labs Lab 09/18/15 0727 09/18/15 1002 09/19/15 0415  NA 139  --  139  K 3.5  --  3.1*  CL 107  --  106  CO2 26  --  25  GLUCOSE 97  --  101*  BUN 13  --  13  CREATININE 1.01 0.90 1.13  CALCIUM 9.2  --  8.6*  AST 20  --   --  ALT 32  --   --   ALKPHOS 48  --   --   BILITOT 0.6  --   --    ------------------------------------------------------------------------------------------------------------------ estimated creatinine clearance is 98.2 mL/min (by C-G formula based on Cr of  1.13). ------------------------------------------------------------------------------------------------------------------  Recent Labs  09/18/15 1002  TSH 1.171    Cardiac Enzymes No results for input(s): CKMB, TROPONINI, MYOGLOBIN in the last 168 hours.  Invalid input(s): CK ------------------------------------------------------------------------------------------------------------------ Invalid input(s): POCBNP ---------------------------------------------------------------------------------------------------------------  RADIOLOGY: Dg Chest 2 View  09/18/2015   CLINICAL DATA:  39 year old male with soft tissue lesions on scrotum. Fever an generalized weakness for 3 days. Initial encounter.  EXAM: CHEST  2 VIEW  COMPARISON:  Chest radiographs 10/18/2014 and earlier.  FINDINGS: Mildly lower lung volumes. Mild crowding of lung markings. Mediastinal contours remain normal. Visualized tracheal air column is within normal limits. No pneumothorax, pulmonary edema, pleural effusion or confluent pulmonary opacity. No acute osseous abnormality identified. Negative visible bowel gas pattern.  IMPRESSION: No acute cardiopulmonary abnormality.   Electronically Signed   By: Odessa Fleming M.D.   On: 09/18/2015 10:00   US Scrotum  09/18/2015   CLINICAL DATA:  Testicular pain.  EXAM: SCROTAL ULTRASOUND  DOPPLER ULTRASOUND OF THE TESTICLES  TECHNIQUE: Complete ultrasound examination of the testicles, epididymis, and other scrotal structures was performed. Color and spectral Doppler ultrasound were also utilized to evaluate blood flow to the testicles.  COMPARISON:  None.  FINDINGS: Right testicle  Measurements: 3.3 x 1.9 x 1.2 cm. No mass or microlithiasis visualized.  Left testicle  Measurements: 2.9 x 2.1 x 1.3 cm. No mass or microlithiasis visualized.  Right epididymis:  Normal in size and appearance.  Left epididymis:  Normal in size and appearance.  Hydrocele:  None visualized.  Varicocele:  None visualized.   Pulsed Doppler interrogation of both testes demonstrates normal low resistance arterial and venous waveforms bilaterally.  However, scrotal skin is significantly thickened and edematous bilaterally. No definite fluid collection or abscess is noted.  IMPRESSION: No evidence of testicular mass or torsion. Severe bilateral scrotal wall thickening is noted consistent with edema or inflammation.   Electronically Signed   By: Lupita Raider, M.D.   On: 09/18/2015 08:37   [place] Art/ven Flow Abd Pelv Doppler  09/18/2015   CLINICAL DATA:  Testicular pain.  EXAM: SCROTAL ULTRASOUND  DOPPLER ULTRASOUND OF THE TESTICLES  TECHNIQUE: Complete ultrasound examination of the testicles, epididymis, and other scrotal structures was performed. Color and spectral Doppler ultrasound were also utilized to evaluate blood flow to the testicles.  COMPARISON:  None.  FINDINGS: Right testicle  Measurements: 3.3 x 1.9 x 1.2 cm. No mass or microlithiasis visualized.  Left testicle  Measurements: 2.9 x 2.1 x 1.3 cm. No mass or microlithiasis visualized.  Right epididymis:  Normal in size and appearance.  Left epididymis:  Normal in size and appearance.  Hydrocele:  None visualized.  Varicocele:  None visualized.  Pulsed Doppler interrogation of both testes demonstrates normal low resistance arterial and venous waveforms bilaterally.  However, scrotal skin is significantly thickened and edematous bilaterally. No definite fluid collection or abscess is noted.  IMPRESSION: No evidence of testicular mass or torsion. Severe bilateral scrotal wall thickening is noted consistent with edema or inflammation.   Electronically Signed   By: Lupita Raider, M.D.   On: 09/18/2015 08:37    EKG:  Orders placed or performed in visit on 03/07/10  . EKG 12-Lead    ASSESSMENT AND PLAN:  Active Problems:  Cellulitis 1. Scrotal cellulitis. Continue antibiotic therapy with Rocephin and vancomycin, following clinically 2. Herpes simplex infection,  Valtrex, Magic mouthwash with lidocaine, change Norco to oxycodone 3. Thrush. Continue Diflucan, adding the Magic mouthwash 4. HIV labs are pending. Infectious disease consultation is obtained and appreciated 5. Dysphagia. Full Liquid diet  Management plans discussed with the patient, family and they are in agreement.   DRUG ALLERGIES:  Allergies  Allergen Reactions  . Tramadol Nausea And Vomiting    CODE STATUS:     Code Status Orders        Start     Ordered   09/18/15 1129  Full code   Continuous     09/18/15 1128      TOTAL TIME TAKING CARE OF THIS PATIENT: 35 minutes.    Katharina Caper M.D on 09/19/2015 at 4:29 PM  Between 7am to 6pm - Pager - [phone]  After 6pm go to [email or web address] - password [email or web address] [facility]  [place] [facility]  Office  [phone]  CC: Primary care physician; No PCP Per Patient

## 2015-09-19 NOTE — Progress Notes (Signed)
Patient remains intermittently febrile Scrotal U/S normal except for wall thickening  Filed Vitals:   09/18/15 2320 09/19/15 0231 09/19/15 0437 09/19/15 0614  BP:   98/51 103/65  Pulse:  82  86  Temp: 101.3 F (38.5 C) 98.2 F (36.8 C) 98.4 F (36.9 C) 99.8 F (37.7 C)  TempSrc: Oral Oral Oral Oral  Resp:   20 16  Height:      Weight:      SpO2:  97% 92% 95%   NAD Soft NT ND The scrotum is indurated and tender to touch. There is no fluctuance, abscess, drainage, crepitus, or fluid pockets.  CBC    Component Value Date/Time   WBC 7.1 09/19/2015 0415   WBC 10.4 12/08/2014 1225   RBC 3.86* 09/19/2015 0415   RBC 4.65 12/08/2014 1225   HGB 11.7* 09/19/2015 0415   HGB 14.2 12/08/2014 1225   HCT 34.9* 09/19/2015 0415   HCT 42.8 12/08/2014 1225   PLT 223 09/19/2015 0415   PLT 279 12/08/2014 1225   MCV 90.4 09/19/2015 0415   MCV 92 12/08/2014 1225   MCH 30.3 09/19/2015 0415   MCH 30.4 12/08/2014 1225   MCHC 33.5 09/19/2015 0415   MCHC 33.1 12/08/2014 1225   RDW 15.5* 09/19/2015 0415   RDW 16.5* 12/08/2014 1225   LYMPHSABS 1.2 09/18/2015 0727   LYMPHSABS 1.9 12/08/2014 1225   MONOABS 0.7 09/18/2015 0727   MONOABS 1.0 12/08/2014 1225   EOSABS 0.1 09/18/2015 0727   EOSABS 0.2 12/08/2014 1225   BASOSABS 0.0 09/18/2015 0727   BASOSABS 0.1 12/08/2014 1225    BMP Latest Ref Rng 09/19/2015 09/18/2015 09/18/2015  Glucose 65 - 99 mg/dL 045(W) - 97  BUN 6 - 20 mg/dL 13 - 13  Creatinine 0.98 - 1.24 mg/dL 1.19 1.47 8.29  Sodium 135 - 145 mmol/L 139 - 139  Potassium 3.5 - 5.1 mmol/L 3.1(L) - 3.5  Chloride 101 - 111 mmol/L 106 - 107  CO2 22 - 32 mmol/L 25 - 26  Calcium 8.9 - 10.3 mg/dL 5.6(O) - 9.2   Cultures pending  Impression/Assessment:  Diffuse infection/HIV/thrush with superficial scrotal ulcers and erythema Scrotum unlikely source of fevers.  No signs of abscess or fournier's gangrene.  Plan:  -Antimicrobials per ID -No urologic intervention at this time -will  follow

## 2015-09-20 DIAGNOSIS — N5082 Scrotal pain: Secondary | ICD-10-CM

## 2015-09-20 DIAGNOSIS — Z21 Asymptomatic human immunodeficiency virus [HIV] infection status: Secondary | ICD-10-CM

## 2015-09-20 DIAGNOSIS — N492 Inflammatory disorders of scrotum: Secondary | ICD-10-CM

## 2015-09-20 DIAGNOSIS — B370 Candidal stomatitis: Secondary | ICD-10-CM

## 2015-09-20 LAB — HEPATITIS B VIRUS (PROFILE VI)
HEP B C IGM: NEGATIVE
HEP B E AG: NEGATIVE
HEP B S AG: NEGATIVE
Hep B Core Total Ab: NEGATIVE
Hep B E Ab: NEGATIVE
Hep B S Ab: NONREACTIVE

## 2015-09-20 LAB — MISC LABCORP TEST (SEND OUT): LABCORP TEST CODE: 505008

## 2015-09-20 LAB — HSV(HERPES SMPLX)ABS-I+II(IGG+IGM)-BLD
HSV 1 Glycoprotein G Ab, IgG: 15.9 index — ABNORMAL HIGH (ref 0.00–0.90)
HSVI/II Comb IgM: 3.49 Ratio — ABNORMAL HIGH (ref 0.00–0.90)

## 2015-09-20 LAB — VANCOMYCIN, TROUGH: Vancomycin Tr: 10 ug/mL (ref 10–20)

## 2015-09-20 LAB — HSV(HERPES SIMPLEX VRS) I + II AB-IGM: HSVI/II Comb IgM: 3.49 Ratio — ABNORMAL HIGH (ref 0.00–0.90)

## 2015-09-20 LAB — HSV(HERPES SIMPLEX VRS) I + II AB-IGG: HSV 1 GLYCOPROTEIN G AB, IGG: 16.8 {index} — AB (ref 0.00–0.90)

## 2015-09-20 LAB — HEPATITIS C ANTIBODY: HCV Ab: <0.1 s/co ratio (ref 0.0–0.9)

## 2015-09-20 MED ORDER — FLUCONAZOLE 100 MG PO TABS
200.0000 mg | ORAL_TABLET | Freq: Every day | ORAL | Status: DC
  Administered 2015-09-21: 200 mg via ORAL
  Filled 2015-09-20: qty 2

## 2015-09-20 MED ORDER — NYSTATIN 100000 UNIT/ML MT SUSP
5.0000 mL | Freq: Four times a day (QID) | OROMUCOSAL | Status: DC
Start: 2015-09-20 — End: 2015-09-21
  Administered 2015-09-20 – 2015-09-21 (×5): 500000 [IU] via OROMUCOSAL
  Filled 2015-09-20 (×5): qty 5

## 2015-09-20 MED ORDER — AMOXICILLIN-POT CLAVULANATE 600-42.9 MG/5ML PO SUSR
600.0000 mg | Freq: Three times a day (TID) | ORAL | Status: DC
  Administered 2015-09-20 – 2015-09-21 (×3): 600 mg via ORAL
  Filled 2015-09-20 (×5): qty 5

## 2015-09-20 MED ORDER — VANCOMYCIN HCL 10 G IV SOLR
1500.0000 mg | Freq: Two times a day (BID) | INTRAVENOUS | Status: DC
  Administered 2015-09-20: 1500 mg via INTRAVENOUS
  Filled 2015-09-20 (×2): qty 1500

## 2015-09-20 NOTE — Progress Notes (Signed)
Patient remains intermittently febrile Scrotal U/S normal except for wall thickening  Filed Vitals:   09/19/15 0614 09/19/15 1534 09/19/15 1615 09/20/15 0526  BP: 103/65 113/70  123/68  Pulse: 86 95  102  Temp: 99.8 F (37.7 C) 100.1 F (37.8 C) 99.5 F (37.5 C) 101.4 F (38.6 C)  TempSrc: Oral Oral Axillary Oral  Resp: Height:      Weight:    201 lb 9.6 oz (91.445 kg)  SpO2: 95% 96%  92%     NAD Soft NT ND The scrotum is indurated and tender to touch with sloughing skin/ulcers. There is no fluctuance, abscess, drainage, crepitus, or fluid pockets. Exam improved from previous day.  CBC    Component Value Date/Time   WBC 7.1 09/19/2015 0415   WBC 10.4 12/08/2014 1225   RBC 3.86* 09/19/2015 0415   RBC 4.65 12/08/2014 1225   HGB 11.7* 09/19/2015 0415   HGB 14.2 12/08/2014 1225   HCT 34.9* 09/19/2015 0415   HCT 42.8 12/08/2014 1225   PLT 223 09/19/2015 0415   PLT 279 12/08/2014 1225   MCV 90.4 09/19/2015 0415   MCV 92 12/08/2014 1225   MCH 30.3 09/19/2015 0415   MCH 30.4 12/08/2014 1225   MCHC 33.5 09/19/2015 0415   MCHC 33.1 12/08/2014 1225   RDW 15.5* 09/19/2015 0415   RDW 16.5* 12/08/2014 1225   LYMPHSABS 1.2 09/18/2015 0727   LYMPHSABS 1.9 12/08/2014 1225   MONOABS 0.7 09/18/2015 0727   MONOABS 1.0 12/08/2014 1225   EOSABS 0.1 09/18/2015 0727   EOSABS 0.2 12/08/2014 1225   BASOSABS 0.0 09/18/2015 0727   BASOSABS 0.1 12/08/2014 1225    BMP Latest Ref Rng 09/19/2015 09/18/2015 09/18/2015  Glucose 65 - 99 mg/dL 161(W) - 97  BUN 6 - 20 mg/dL 13 - 13  Creatinine 9.60 - 1.24 mg/dL 4.54 0.98 1.19  Sodium 135 - 145 mmol/L 139 - 139  Potassium 3.5 - 5.1 mmol/L 3.1(L) - 3.5  Chloride 101 - 111 mmol/L 106 - 107  CO2 22 - 32 mmol/L 25 - 26  Calcium 8.9 - 10.3 mg/dL 1.4(N) - 9.2      Cultures pending  Impression/Assessment:  Diffuse infection/HIV/thrush with superficial scrotal ulcers and erythema Scrotum unlikely source of fevers.  No signs of  abscess or fournier's gangrene.  Plan:  -Antimicrobials per ID -No urologic intervention at this time -will follow

## 2015-09-20 NOTE — Progress Notes (Signed)
[facility] -  at [facility]   PATIENT NAME: Lance Reynolds    MR#:  [id number]  DATE OF BIRTH:  Aug 24, 1976  SUBJECTIVE:  CHIEF COMPLAINT:   Chief Complaint  Patient presents with  . Groin Pain   the patient is 39 year old Caucasian male with past medical history significant for history of thrush in the past who presents to the hospital with erythema of the scrotum and ulcerations of scrotal skin, as well as periorbital area. Lips and mouth, he was diagnosed with cellulitis of scrotum as well as herpetic rash, was initiated on the Rocephin and vancomycin for cellulitis, as well as Diflucan and Valtrex for thrush and hepatitic lesions. 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 Infectious disease consultation is obtained, labs are ordered.   Review of Systems  Constitutional: Negative for fever, chills and weight loss.  HENT: Negative for congestion.   Eyes: Negative for blurred vision and double vision.  Respiratory: Negative for cough, sputum production, shortness of breath and wheezing.   Cardiovascular: Negative for chest pain, palpitations, orthopnea, leg swelling and PND.  Gastrointestinal: Negative for nausea, vomiting, abdominal pain, diarrhea, constipation and blood in stool.  Genitourinary: Negative for dysuria, urgency, frequency and hematuria.  Musculoskeletal: Negative for falls.  Neurological: Negative for dizziness, tremors, focal weakness and headaches.  Endo/Heme/Allergies: Does not bruise/bleed easily.  Psychiatric/Behavioral: Negative for depression. The patient does not have insomnia.    Patient complains of severe scrotal pain with movements of walking, inability to eat solid food VITAL SIGNS: Blood pressure 123/68, pulse 102, temperature 101.4 F (38.6 C), temperature source Oral, resp. rate 20, height  (1.854 m), weight 91.445 kg (201 lb 9.6 oz), SpO2 92 %.  PHYSICAL EXAMINATION:   GENERAL:  39 y.o.-year-old patient lying in the bed with no  acute distress. Lip angle ulcerations, swelling, concerning for hepatic lesions, oral thrush, still unable to open his mouth EYES: Pupils equal, round, reactive to light and accommodation. No scleral icterus. Extraocular muscles intact.  HEENT: Head atraumatic, normocephalic. Oropharynx and nasopharynx clear.  NECK:  Supple, no jugular venous distention. No thyroid enlargement, no tenderness.  LUNGS: Normal breath sounds bilaterally, no wheezing, rales,rhonchi or crepitation. No use of accessory muscles of respiration.  CARDIOVASCULAR: S1, S2 normal. No murmurs, rubs, or gallops.  ABDOMEN: Soft, nontender, nondistended. Bowel sounds present. No organomegaly or mass.  EXTREMITIES: No pedal edema, cyanosis, or clubbing. Scrotal edema and erythema with superficial ulcerations and yellow slough NEUROLOGIC: Cranial nerves II through XII are intact. Muscle strength 5/5 in all extremities. Sensation intact. Gait not checked.  PSYCHIATRIC: The patient is alert and oriented x 3.  SKIN: Scrotal ulcerations and purulent discharge are drying, erythema has subsided significant pain on palpation, still, as well as swelling   ORDERS/RESULTS REVIEWED:   CBC  Recent Labs Lab 09/18/15 0727 09/19/15 0415  WBC 6.8 7.1  HGB 13.0 11.7*  HCT 38.8* 34.9*  PLT 234 223  MCV 90.0 90.4  MCH 30.2 30.3  MCHC 33.6 33.5  RDW 15.8* 15.5*  LYMPHSABS 1.2  --   MONOABS 0.7  --   EOSABS 0.1  --   BASOSABS 0.0  --    ------------------------------------------------------------------------------------------------------------------  Chemistries   Recent Labs Lab 09/18/15 0727 09/18/15 1002 09/19/15 0415  NA 139  --  139  K 3.5  --  3.1*  CL 107  --  106  CO2 26  --  25  GLUCOSE 97  --  101*  BUN 13  --  13  CREATININE 1.01 0.90 1.13  CALCIUM 9.2  --  8.6*  AST 20  --   --   ALT 32  --   --   ALKPHOS 48  --   --   BILITOT 0.6  --   --     ------------------------------------------------------------------------------------------------------------------ estimated creatinine clearance is 98.2 mL/min (by C-G formula based on Cr of 1.13). ------------------------------------------------------------------------------------------------------------------  Recent Labs  09/18/15 1002  TSH 1.171    Cardiac Enzymes No results for input(s): CKMB, TROPONINI, MYOGLOBIN in the last 168 hours.  Invalid input(s): CK ------------------------------------------------------------------------------------------------------------------ Invalid input(s): POCBNP ---------------------------------------------------------------------------------------------------------------  RADIOLOGY: No results found.  EKG:  Orders placed or performed in visit on 03/07/10  . EKG 12-Lead    ASSESSMENT AND PLAN:  Active Problems:   Cellulitis 1. Scrotal cellulitis. Continue antibiotic therapy with Rocephin and vancomycin, improved clinically.  2. Herpes simplex infection, Valtrex, Magic mouthwash with lidocaine,  Oxycodone, nystatin swish and swallow 3. Thrush. Continue Diflucan, Magic mouthwash with lidocaine as well as nystatin swish and swallow, still significant discomfort 4. HIV,  CD4 count is 244. Infectious disease consultation is obtained and appreciated, questionable TB testing in this immunocompromised state 5. Dysphagia. Full Liquid diet  Management plans discussed with the patient, family and they are in agreement.   DRUG ALLERGIES:  Allergies  Allergen Reactions  . Tramadol Nausea And Vomiting    CODE STATUS:     Code Status Orders        Start     Ordered   09/18/15 1129  Full code   Continuous     09/18/15 1128      TOTAL TIME TAKING CARE OF THIS PATIENT: 35 minutes.    Katharina Caper M.D on 09/20/2015 at 3:09 PM  Between 7am to 6pm - Pager - [phone]  After 6pm go to [email or web address] - password [email or web address] [facility]  [place]  [place] [facility]  Office  [phone]  CC: Primary care physician; No PCP Per Patient

## 2015-09-20 NOTE — Care Management (Addendum)
Admitted to [facility] with the diagnosis of cellulitis. Lives with friend, Serina Cowper [phone]). Current employed as a Curator. No insurance. No primary care physician. Several falls in the past. New diagnosis of HIV.                                                                                                                   Spoke with Raynelle Fanning at [facility] [phone]). Will be followed by Dr. Sampson Goon. Appointment is scheduled for October 20 th at [facility], This agency will be helping with medications and education and other resources as needed. Labs faxed to [facility] Gwenette Greet RN MSN Care Management [phone]

## 2015-09-20 NOTE — Progress Notes (Signed)
Physical Therapy Treatment Patient Details Name: Lance Reynolds MRN: [id number] DOB: 05-14-1976 Today's Date: 09/20/2015    History of Present Illness Patient admitted with rash on genitals and thrush, found to be HIV positive in ER. Patient with very painful testicular region secondary to swelling/inflammation.     PT Comments    Patient demonstrates significantly improved tolerance with ambulation as well as balance. Initially patient used RW for ambulation, PT had patient ambulate with IV pole and ultimately no AD. Patient appears best with IV pole and at this time was recommended for use with a SPC. Patient demonstrates WNL standing balance independently and slightly decreased gait speed with IV pole, significantly decreased with RW. At this time patient appears to be returning to his baseline mobility status, though balance deficits continue to be noted as he takes short steps with independent ambulation. Skilled PT services continue to be indicated to address his mobility deficits.   Follow Up Recommendations  Home health PT     Equipment Recommendations  Cane    Recommendations for Other Services       Precautions / Restrictions Precautions Precautions: Fall Restrictions Weight Bearing Restrictions: No    Mobility  Bed Mobility Overal bed mobility: Independent             General bed mobility comments: Slightly slower than baseline, no assistance required.   Transfers Overall transfer level: Modified independent Equipment used: Rolling walker (2 wheeled)             General transfer comment: Patient does not demonstrate any loss of balance with sit to stand transfers, uses RW in this session and independently on one occassion.   Ambulation/Gait Ambulation/Gait assistance: Modified independent (Device/Increase time) Ambulation Distance (Feet): 360 Feet (160' with IV pole, 5' with no AD) Assistive device: Rolling walker (2 wheeled) Gait Pattern/deviations:  Step-through pattern;Decreased step length - right;Decreased step length - left;Narrow base of support   Gait velocity interpretation: Below normal speed for age/gender General Gait Details: Patient initially ambulates with RW no loss of balance modified DGI of 11/12, switched to IV pole, narrow BOS noted no loss of balance and increased gait speed. No loss of balance for short distances with no AD.   Stairs            Wheelchair Mobility    Modified Rankin (Stroke Patients Only)       Balance Overall balance assessment: Needs assistance Sitting-balance support: No upper extremity supported Sitting balance-Leahy Scale: Normal     Standing balance support: Bilateral upper extremity supported Standing balance-Leahy Scale: Fair Standing balance comment: Patient demonstrates improved balance from previous PT session. modified DGI of 11/12, no loss of balance noted with sit to stand or ambulation.                    Cognition Arousal/Alertness: Awake/alert Behavior During Therapy: Agitated;WFL for tasks assessed/performed Overall Cognitive Status: Within Functional Limits for tasks assessed                      Exercises Other Exercises Other Exercises: 5x sit to stand 30 seconds, likely due to lethargy not ability per PT evaluation.  Other Exercises: Assisted patient with using bathroom, used IV pole to assist with balance upon entry, able to balance independently then ambulate back to bed with "furniture cruising" and no loss of balance.     General Comments        Pertinent Vitals/Pain Pain Assessment:  (Patient  reports pain around his mouth, no other comments of pain)    Home Living                      Prior Function            PT Goals (current goals can now be found in the care plan section) Acute Rehab PT Goals Patient Stated Goal: To return home and decrease pain  PT Goal Formulation: With patient Time For Goal Achievement:  10/02/15 Potential to Achieve Goals: Good Progress towards PT goals: Progressing toward goals    Frequency  Min 2X/week    PT Plan Current plan remains appropriate    Co-evaluation             End of Session Equipment Utilized During Treatment: Gait belt Activity Tolerance: Patient tolerated treatment well Patient left: in bed;with call bell/phone within reach;with bed alarm set     Time: 1457-1520 PT Time Calculation (min) (ACUTE ONLY): 23 min  Charges:  $Gait Training: 23-37 mins                    G Codes:      Kerin Ransom, PT, DPT    09/20/2015, 3:40 PM

## 2015-09-20 NOTE — Progress Notes (Signed)
   09/20/15 1015  Clinical Encounter Type  Visited With Patient  Visit Type Initial  Provided pastoral presence and support to patient on unit.  [employer] Jilliam Bellmore-pager [phone]

## 2015-09-20 NOTE — Progress Notes (Signed)
[facility] CLINIC INFECTIOUS DISEASE PROGRESS NOTE Date of Admission:  09/18/2015     ID: Lance Reynolds is a 39 y.o. male with scrotal cellulitis, new dx HIV Active Problems:   Cellulitis   Subjective: Fevers decreasing but still present, Still with oral pain, diff eating, ulcers. Scrotum improving. Wife tested neg for HIV  ROS  Eleven systems are reviewed and negative except per hpi  Medications:  Antibiotics Given (last 72 hours)    Date/Time Action Medication Dose Rate   09/18/15 1424 Given   vancomycin (VANCOCIN) 1,250 mg in sodium chloride 0.9 % 250 mL IVPB 1,250 mg 166.7 mL/hr   09/18/15 1615 Given   valACYclovir (VALTREX) tablet 1,000 mg 1,000 mg    09/18/15 2139 Given   vancomycin (VANCOCIN) 1,250 mg in sodium chloride 0.9 % 250 mL IVPB 1,250 mg 166.7 mL/hr   09/18/15 2139 Given   valACYclovir (VALTREX) tablet 1,000 mg 1,000 mg    09/19/15 0906 Given   vancomycin (VANCOCIN) 1,250 mg in sodium chloride 0.9 % 250 mL IVPB 1,250 mg 166.7 mL/hr   09/19/15 0906 Given   valACYclovir (VALTREX) tablet 1,000 mg 1,000 mg    09/19/15 1059 Given   cefTRIAXone (ROCEPHIN) 1 g in dextrose 5 % 50 mL IVPB 1 g 100 mL/hr   09/19/15 1606 Given   valACYclovir (VALTREX) tablet 1,000 mg 1,000 mg    09/19/15 2116 Given   vancomycin (VANCOCIN) 1,250 mg in sodium chloride 0.9 % 250 mL IVPB 1,250 mg 166.7 mL/hr   09/19/15 2117 Given   valACYclovir (VALTREX) tablet 1,000 mg 1,000 mg    09/20/15 1610 Given   valACYclovir (VALTREX) tablet 1,000 mg 1,000 mg    09/20/15 1116 Given   cefTRIAXone (ROCEPHIN) 1 g in dextrose 5 % 50 mL IVPB 1 g 100 mL/hr   09/20/15 1231 Given   vancomycin (VANCOCIN) 1,500 mg in sodium chloride 0.9 % 500 mL IVPB 1,500 mg 250 mL/hr   09/20/15 1544 Given   valACYclovir (VALTREX) tablet 1,000 mg 1,000 mg    09/20/15 1710 Given   amoxicillin-clavulanate (AUGMENTIN) 600-42.9 MG/5ML suspension 600 mg 600 mg      . amoxicillin-clavulanate  600 mg of amoxicillin Oral TID   . enoxaparin (LOVENOX) injection  40 mg Subcutaneous Q24H  . feeding supplement  1 Container Oral TID WC  . [START ON 09/21/2015] fluconazole  200 mg Oral Daily  . Influenza vac split quadrivalent PF  0.5 mL Intramuscular Tomorrow-1000  . magic mouthwash  10 mL Oral QID  . multivitamin with minerals  1 tablet Oral Daily  . nystatin  5 mL Mouth/Throat QID  . nystatin-triamcinolone   Topical BID  . valACYclovir  1,000 mg Oral TID    Objective: Vital signs in last 24 hours: Temp:  [98 F (36.7 C)-101.4 F (38.6 C)] 98 F (36.7 C) (10/07 1544) Pulse Rate:  [73-102] 73 (10/07 1544) Resp:  [20] 20 (10/07 1544) BP: (105-123)/(64-68) 105/64 mmHg (10/07 1544) SpO2:  [92 %-98 %] 98 % (10/07 1544) Weight:  [91.445 kg (201 lb 9.6 oz)] 91.445 kg (201 lb 9.6 oz) (10/07 0526)  Constitutional: He is oriented to person, place, and time. He appears dishelved, chronically ill appearing HENT: R post forehead with 2 cm cystic mass, mild ttp, not red or inflamed Mouth/Throat: Oropharynx is clear - + thrush, multiple ulcers, erosion at corner of lips No oropharyngeal exudate.  Cardiovascular: Normal rate, regular rhythm and normal heart sounds. Exam reveals no gallop and no friction rub.  Pulmonary/Chest: Effort normal and breath sounds normal. No respiratory distress. He has no wheezes.  Abdominal: Soft. Bowel sounds are normal. He exhibits no distension. There is no tenderness.  GU - mild scrotal swelling, thickened skin, several dry ulcers which are painful Lymphadenopathy: He has shoddy cervical adenopathy.  Neurological: He is alert and oriented to person, place, and time.  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 His behavior is normal.   Lab Results  Recent Labs  09/18/15 0727 09/18/15 1002 09/19/15 0415  WBC 6.8  --  7.1  HGB 13.0  --  11.7*  HCT 38.8*  --  34.9*  NA 139  --  139  K 3.5  --  3.1*  CL 107  --  106  CO2 26  --   25  BUN 13  --  13  CREATININE 1.01 0.90 1.13    Microbiology: Results for orders placed or performed during the hospital encounter of 09/18/15  Chlamydia/NGC rt PCR ([facility] only)     Status: None   Collection Time: 09/18/15  7:27 AM  Result Value Ref Range Status   Specimen source GC/Chlam URINE, RANDOM  Final   Chlamydia Tr NOT DETECTED NOT DETECTED Final   N gonorrhoeae NOT DETECTED NOT DETECTED Final    Comment: (NOTE) 100  This methodology has not been evaluated in pregnant women or in 200  patients with a history of hysterectomy. 300 400  This methodology will not be performed on patients less than 48  years of age.   Urine culture     Status: None   Collection Time: 09/18/15  7:27 AM  Result Value Ref Range Status   Specimen Description URINE, RANDOM  Final   Special Requests NONE  Final   Culture NO GROWTH  Final   Report Status 09/19/2015 FINAL  Final  Blood culture (routine x 2)     Status: None (Preliminary result)   Collection Time: 09/18/15 10:02 AM  Result Value Ref Range Status   Specimen Description BLOOD RIGHT ARM  Final   Special Requests BOTTLES DRAWN AEROBIC AND ANAEROBIC 7CC  Final   Culture NO GROWTH 2 DAYS  Final   Report Status PENDING  Incomplete  Blood culture (routine x 2)     Status: None (Preliminary result)   Collection Time: 09/18/15 10:07 AM  Result Value Ref Range Status   Specimen Description BLOOD LEFT ARM  Final   Special Requests BOTTLES DRAWN AEROBIC AND ANAEROBIC 7CC  Final   Culture NO GROWTH 2 DAYS  Final   Report Status PENDING  Incomplete    Studies/Results: No results found.  Assessment/Plan: Lance Reynolds is a 39 y.o. 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 He has prurigo nodularis skin lesions, shoddy cervical LAN and a cystic lesion on his forehead. Has lost 10#s   Recommendations Scrotal cellulitis- change to augmentin Cont valtrex for the ulcers which are probably herpetic   pendingHSV serology  HIV - new dx  CD4 - 244 VL, genotype, RPR, pending Hep serologies neg  Continued fevers - suspect his oral infeciton is source Should improve over next 24-48 hrs  Thrush - fluconazole   Scalp cyst - atypical appearance - will need bxp- does not seem to be Kaposis however. Thank you very much for the consult. Will follow with you.  Amarria Andreasen   09/20/2015, 7:40 PM

## 2015-09-20 NOTE — Plan of Care (Addendum)
Problem: Phase I Progression Outcomes Goal: Initial discharge plan identified Outcome: Progressing Pt is alert and oriented x 4, c/o headache in am improved with morphine, IV fluids infusing, good urine output, ambulated with physical therapy, febrile improved with tylenol, vital signs stable, on IV and po antibiotics, medicated ointment applied to scrotum, family at bedside in evening, utilizing urinal, consulted with ID physician, tolerating full liquid diet, refusing ensures, denies n/v, potassium serum on 10/6 at 3.1, iv fluids with potassium infusing. Pt c/o mild irritation at IV site, improved with arm elevated on pillow, IV flushes fine with normal saline and shows no evidence of infiltration,Uneventful shift.

## 2015-09-20 NOTE — Progress Notes (Signed)
ANTIBIOTIC CONSULT NOTE - Follow Up  Pharmacy Consult for Vancomycin Indication: cellulitis  Allergies  Allergen Reactions  . Tramadol Nausea And Vomiting    Patient Measurements: Height:  (185.4 cm) Weight: 201 lb 9.6 oz (91.445 kg) IBW/kg (Calculated) : 79.9 Adjusted Body Weight: 83.8 kg  Vital Signs: Temp: 101.4 F (38.6 C) (10/07 0526) Temp Source: Oral (10/07 0526) BP: 123/68 mmHg (10/07 0526) Pulse Rate: 102 (10/07 0526) Intake/Output from previous day: 10/06 0701 - 10/07 0700 In: 870 [P.O.:870] Out: 350 [Urine:350] Intake/Output from this shift: Total I/O In: 530 [P.O.:480; IV Piggyback:50] Out: 400 [Urine:400]  Labs:  Recent Labs  09/18/15 0727 09/18/15 1002 09/19/15 0415  WBC 6.8  --  7.1  HGB 13.0  --  11.7*  PLT 234  --  223  CREATININE 1.01 0.90 1.13   Estimated Creatinine Clearance: 98.2 mL/min (by C-G formula based on Cr of 1.13).  Recent Labs  09/20/15 0834  [facility] 10     Microbiology: Recent Results (from the past 720 hour(s))  Chlamydia/NGC rt PCR ([facility] only)     Status: None   Collection Time: 09/18/15  7:27 AM  Result Value Ref Range Status   Specimen source GC/Chlam URINE, RANDOM  Final   Chlamydia Tr NOT DETECTED NOT DETECTED Final   N gonorrhoeae NOT DETECTED NOT DETECTED Final    Comment: (NOTE) 100  This methodology has not been evaluated in pregnant women or in 200  patients with a history of hysterectomy. 300 400  This methodology will not be performed on patients less than 51  years of age.   Urine culture     Status: None   Collection Time: 09/18/15  7:27 AM  Result Value Ref Range Status   Specimen Description URINE, RANDOM  Final   Special Requests NONE  Final   Culture NO GROWTH  Final   Report Status 09/19/2015 FINAL  Final  Blood culture (routine x 2)     Status: None (Preliminary result)   Collection Time: 09/18/15 10:02 AM  Result Value Ref Range Status   Specimen Description BLOOD RIGHT ARM   Final   Special Requests BOTTLES DRAWN AEROBIC AND ANAEROBIC 7CC  Final   Culture NO GROWTH 2 DAYS  Final   Report Status PENDING  Incomplete  Blood culture (routine x 2)     Status: None (Preliminary result)   Collection Time: 09/18/15 10:07 AM  Result Value Ref Range Status   Specimen Description BLOOD LEFT ARM  Final   Special Requests BOTTLES DRAWN AEROBIC AND ANAEROBIC 7CC  Final   Culture NO GROWTH 2 DAYS  Final   Report Status PENDING  Incomplete    Medical History: Past Medical History  Diagnosis Date  . Anxiety     Medications:  Scheduled:  . cefTRIAXone (ROCEPHIN)  IV  1 g Intravenous Q24H  . enoxaparin (LOVENOX) injection  40 mg Subcutaneous Q24H  . feeding supplement  1 Container Oral TID WC  . fluconazole (DIFLUCAN) IV  200 mg Intravenous Q24H  . Influenza vac split quadrivalent PF  0.5 mL Intramuscular Tomorrow-1000  . magic mouthwash  10 mL Oral QID  . multivitamin with minerals  1 tablet Oral Daily  . nystatin-triamcinolone   Topical BID  . valACYclovir  1,000 mg Oral TID  . vancomycin  1,250 mg Intravenous Once  . vancomycin  1,500 mg Intravenous Q12H   Assessment: Pharmacy consulted to dose and monitor Vancomycin in a 39 yo male for empiric treatment  of cellulitis.  US showing no abscess.  Patient with new diagnosis of HIV and receiving treatment for thrush with Fluconazole 200 mg IV q24h.  ID consulted. Patient currently receiving Vancomycin 1250 mg IV q12h.  SCr: 1.13, est CrCl~98.2 mL/min, ke: 0.086, t1/2: 8 h, Vd: 59.1 L  BCx: NGTD x 2, UCx: NG, CD4 count pending  Vanc Trough: 10 mcg/mL   Goal of Therapy:  Vancomycin trough level 10-15 mcg/ml  Plan:  Will transition patient from Vancomycin 1250 mg IV q12h to 1500 mg IV q12h.  Patient remains febrile and there is concern that trough may not remain within goal.  Will recheck trough prior to 4th dose on 10/8 at 2330.CD4 count pending. Follow up culture results   Pharmacy will continue to  follow.  Clarisa Schools, PharmD Clinical Pharmacist 09/20/2015

## 2015-09-20 NOTE — Plan of Care (Signed)
Problem: Discharge Progression Outcomes Goal: Home Health Care arrangements in place Outcome: Progressing Plan of care progress to goal: Pt continues to have a lot of pain in the scrotum and mouth areas.  Morphine given once during this shift, otherwise oxycodone is effective. VSS Full liquid diet given for comfort.  Pills given in applesauce. Pt walked around the unit once with family and tolerated well.

## 2015-09-21 DIAGNOSIS — N492 Inflammatory disorders of scrotum: Secondary | ICD-10-CM

## 2015-09-21 DIAGNOSIS — Z21 Asymptomatic human immunodeficiency virus [HIV] infection status: Secondary | ICD-10-CM

## 2015-09-21 DIAGNOSIS — N5082 Scrotal pain: Secondary | ICD-10-CM

## 2015-09-21 DIAGNOSIS — B002 Herpesviral gingivostomatitis and pharyngotonsillitis: Secondary | ICD-10-CM

## 2015-09-21 DIAGNOSIS — B370 Candidal stomatitis: Secondary | ICD-10-CM

## 2015-09-21 DIAGNOSIS — B20 Human immunodeficiency virus [HIV] disease: Secondary | ICD-10-CM

## 2015-09-21 MED ORDER — AMOXICILLIN-POT CLAVULANATE 875-125 MG PO TABS: 1.0000 | ORAL_TABLET | Freq: Two times a day (BID) | ORAL | Status: DC

## 2015-09-21 MED ORDER — VALACYCLOVIR HCL 1 G PO TABS: 1000.0000 mg | ORAL_TABLET | Freq: Three times a day (TID) | ORAL | Status: DC

## 2015-09-21 MED ORDER — OXYCODONE HCL 5 MG PO TABS: 5.0000 mg | ORAL_TABLET | Freq: Four times a day (QID) | ORAL | Status: DC | PRN

## 2015-09-21 MED ORDER — FLUCONAZOLE 200 MG PO TABS: 200.0000 mg | ORAL_TABLET | Freq: Every day | ORAL | Status: DC

## 2015-09-21 NOTE — Progress Notes (Signed)
Reports ready to go home. Has med resources. Stressed importance of taking meds as ordered and following up with MD. Ambulated with 4 wheel walker to WC. Oral and written AVS discharge instructions given with stated understanding. Transported in [facility] to visitors entrance. Discharge home.

## 2015-09-21 NOTE — Discharge Summary (Signed)
[facility] [place] [facility]   PATIENT NAME: Lance Reynolds    MR#:  [id number]  DATE OF BIRTH:  09-Mar-1976  DATE OF ADMISSION:  09/18/2015 ADMITTING PHYSICIAN: Alfredo Martinez, MD  DATE OF DISCHARGE: 09/21/2015  PRIMARY CARE PHYSICIAN: No PCP Per Patient    ADMISSION DIAGNOSIS:  Thrush [B37.0] Testicular pain [N50.819] Cellulitis, scrotum [N49.2] HIV (human immunodeficiency virus infection) (HCC) [Z21]  DISCHARGE DIAGNOSIS:  Active Problems:   Cellulitis   Herpetic gingivostomatitis   HIV disease (HCC)   Cellulitis of scrotum   SECONDARY DIAGNOSIS:   Past Medical History  Diagnosis Date  . Anxiety     HOSPITAL COURSE:   39 year old male with no significant past medical history presents with scrotal cellulitis and also noted to have herpes infection  #1 scrotal cellulitis-blood cultures are negative. -Improving with Augmentin. Discharge on total 10 day course of antibiotics.  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 The scrotal lesions are scabbed of. -Magic mouthwash as needed.  #3 oral thrush-can be discharged on Diflucan.  #4 HIV-CD4 count is 244. Appreciate ID consult. -Health department contacted. Patient should follow up as outpatient with infectious diseases.  Discharge today  DISCHARGE CONDITIONS:   Guarded  CONSULTS OBTAINED:  Treatment Team:  Clydie Braun, MD Alfredo Martinez, MD  DRUG ALLERGIES:   Allergies  Allergen Reactions  . Tramadol Nausea And Vomiting    DISCHARGE MEDICATIONS:   Current Discharge Medication List    START taking these medications   Details  amoxicillin-clavulanate (AUGMENTIN) 875-125 MG tablet Take 1 tablet by mouth 2 (two) times daily. Qty: 16 tablet, Refills: 0    fluconazole (DIFLUCAN) 200 MG tablet Take 1 tablet (200 mg total) by mouth daily. Qty: 9 tablet, Refills: 0    oxyCODONE (OXY IR/ROXICODONE) 5 MG immediate release  tablet Take 1-2 tablets (5-10 mg total) by mouth every 6 (six) hours as needed for moderate pain or severe pain. Qty: 20 tablet, Refills: 0    valACYclovir (VALTREX) 1000 MG tablet Take 1 tablet (1,000 mg total) by mouth 3 (three) times daily. Qty: 21 tablet, Refills: 0      CONTINUE these medications which have NOT CHANGED   Details  meloxicam (MOBIC) 15 MG tablet Take 1 tablet (15 mg total) by mouth daily as needed for pain. Qty: 10 tablet, Refills: 0      STOP taking these medications     acetaminophen (TYLENOL) 325 MG tablet      cephALEXin (KEFLEX) 500 MG capsule      acetaminophen-codeine (TYLENOL #3) 300-30 MG per tablet      Diphenhyd-Hydrocort-Nystatin (FIRST-DUKES MOUTHWASH) SUSP      oxyCODONE-acetaminophen (ROXICET) 5-325 MG per tablet      permethrin (ELIMITE) 5 % cream          DISCHARGE INSTRUCTIONS:   1. Health department to follow up 2. F/u with [facility] ID in 1 week  If you experience worsening of your admission symptoms, develop shortness of breath, life threatening emergency, suicidal or homicidal thoughts you must seek medical attention immediately by calling 911 or calling your MD immediately  if symptoms less severe.  You Must read complete instructions/literature along with all the possible adverse reactions/side effects for all the Medicines you take and that have been prescribed to you. Take any new Medicines after you have completely understood and accept all the possible adverse reactions/side effects.   Please note  You were cared for by a hospitalist during your  hospital stay. If you have any questions about your discharge medications or the care you received while you were in the hospital after you are discharged, you can call the unit and asked to speak with the hospitalist on call if the hospitalist that took care of you is not available. Once you are discharged, your primary care physician will handle any further medical issues. Please note that  NO REFILLS for any discharge medications will be authorized once you are discharged, as it is imperative that you return to your primary care physician (or establish a relationship with a primary care physician if you do not have one) for your aftercare needs so that they can reassess your need for medications and monitor your lab values.    Today   CHIEF COMPLAINT:   Chief Complaint  Patient presents with  . Groin Pain    VITAL SIGNS:  Blood pressure 109/64, pulse 79, temperature 99.2 F (37.3 C), temperature source Oral, resp. rate 18, height 6\' 1"  (1.854 m), weight 88.111 kg (194 lb 4 oz), SpO2 97 %.  I/O:   Intake/Output Summary (Last 24 hours) at 09/21/15 1047 Last data filed at 09/21/15 1040  Gross per 24 hour  Intake   5969 ml  Output   2550 ml  Net   3419 ml    PHYSICAL EXAMINATION:   Physical Exam  GENERAL:  39 y.o.-year-old patient lying in the bed with no acute distress.  EYES: Pupils equal, round, reactive to light and accommodation. No scleral icterus. Extraocular muscles intact.  HEENT: Head atraumatic, normocephalic.  Small sebaceous cyst on vertex towards right side, nasopharynx clear. Oropharynx with oral thrush, angle of the mouth ulcers NECK:  Supple, no jugular venous distention. No thyroid enlargement, no tenderness.  LUNGS: Normal breath sounds bilaterally, no wheezing, rales,rhonchi or crepitation. No use of accessory muscles of respiration.  CARDIOVASCULAR: S1, S2 normal. No murmurs, rubs, or gallops.  ABDOMEN: Soft, non-tender, non-distended. Bowel sounds present. No organomegaly or mass.  GENITOURINARY: scrotal erythema and some swelling and tenderness present- much improved from before. Scabbed off lesions on the scrotum noted. EXTREMITIES: No pedal edema, cyanosis, or clubbing.  NEUROLOGIC: Cranial nerves II through XII are intact. Muscle strength 5/5 in all extremities. Sensation intact. Gait not checked.  PSYCHIATRIC: The patient is alert and  oriented x 3.  SKIN: No obvious rash, lesion, or ulcer. Healed prurigo nodularis lesions  DATA REVIEW:   CBC  Recent Labs Lab 09/19/15 0415  WBC 7.1  HGB 11.7*  HCT 34.9*  PLT 223    Chemistries   Recent Labs Lab 09/18/15 0727  09/19/15 0415  NA 139  --  139  K 3.5  --  3.1*  CL 107  --  106  CO2 26  --  25  GLUCOSE 97  --  101*  BUN 13  --  13  CREATININE 1.01  < > 1.13  CALCIUM 9.2  --  8.6*  AST 20  --   --   ALT 32  --   --   ALKPHOS 48  --   --   BILITOT 0.6  --   --   < > = values in this interval not displayed.  Cardiac Enzymes No results for input(s): TROPONINI in the last 168 hours.  Microbiology Results  Results for orders placed or performed during the hospital encounter of 09/18/15  Chlamydia/NGC rt PCR [facility] only)     Status: None   Collection Time: 09/18/15  7:27  AM  Result Value Ref Range Status   Specimen source GC/Chlam URINE, RANDOM  Final   Chlamydia Tr NOT DETECTED NOT DETECTED Final   N gonorrhoeae NOT DETECTED NOT DETECTED Final    Comment: (NOTE) 100  This methodology has not been evaluated in pregnant women or in 200  patients with a history of hysterectomy. 300 400  This methodology will not be performed on patients less than 33  years of age.   Urine culture     Status: None   Collection Time: 09/18/15  7:27 AM  Result Value Ref Range Status   Specimen Description URINE, RANDOM  Final   Special Requests NONE  Final   Culture NO GROWTH  Final   Report Status 09/19/2015 FINAL  Final  Blood culture (routine x 2)     Status: None (Preliminary result)   Collection Time: 09/18/15 10:02 AM  Result Value Ref Range Status   Specimen Description BLOOD RIGHT ARM  Final   Special Requests BOTTLES DRAWN AEROBIC AND ANAEROBIC 7CC  Final   Culture NO GROWTH 2 DAYS  Final   Report Status PENDING  Incomplete  Blood culture (routine x 2)     Status: None (Preliminary result)   Collection Time: 09/18/15 10:07 AM  Result Value Ref Range  Status   Specimen Description BLOOD LEFT ARM  Final   Special Requests BOTTLES DRAWN AEROBIC AND ANAEROBIC 7CC  Final   Culture NO GROWTH 2 DAYS  Final   Report Status PENDING  Incomplete    RADIOLOGY:  No results found.  EKG:   Orders placed or performed in visit on 03/07/10  . EKG 12-Lead      Management plans discussed with the patient, family and they are in agreement.  CODE STATUS:     Code Status Orders        Start     Ordered   09/18/15 1129  Full code   Continuous     09/18/15 1128      TOTAL TIME TAKING CARE OF THIS PATIENT: 36 minutes.    Moncia Annas M.D on 09/21/2015 at 10:47 AM  Between 7am to 6pm - Pager - [phone]  After 6pm go to [email or web address] - password [email or web address] [facility]  [facility]  Office  [phone]  CC: Primary care physician; No PCP Per Patient

## 2015-09-21 NOTE — Plan of Care (Signed)
Problem: Discharge Progression Outcomes Goal: Home Health Care arrangements in place Outcome: Progressing Plan of care progress to goal for: 1. Pain-pt c/o pain in his arm, prn meds given with improvement 2. Hemodynamically-             -VSS, pt remains afebrile this shift             -IV fluids continue 3. Complications-no evidence of this shift 4. Diet-pt tolerating diet this shift 5. Activity-pt up with assistance

## 2015-09-21 NOTE — Care Management Note (Signed)
Case Management Note  Patient Details  Name: Lance Reynolds MRN: [id number] Date of Birth: 08-Jun-1976  Subjective/Objective:   Entered [name] into the [facility] Program and gave him the [facility] Coupon to take to a pharmacy on the list attached to the coupon. He agreed to call us if he has any problems getting his medication through the [facility] Program. Provided [name] with an application to the [facility] and gave him a list of clinics who could assist him with meds and medical care. Strongly encouraged [name] to find a physician or PA to provide care for his chronic illnesses.                Action/Plan:   Expected Discharge Date:                  Expected Discharge Plan:     In-House Referral:     Discharge planning Services     Post Acute Care Choice:    Choice offered to:     DME Arranged:    DME Agency:     HH Arranged:    HH Agency:     Status of Service:     Medicare Important Message Given:    Date Medicare IM Given:    Medicare IM give by:    Date Additional Medicare IM Given:    Additional Medicare Important Message give by:     If discussed at Long Length of Stay Meetings, dates discussed:    Additional Comments:  Finlay Mills A, RN 09/21/2015, 12:13 PM

## 2015-09-21 NOTE — Progress Notes (Signed)
Care Management sent list for [facility] Program including [facility]; list given to pt; no further questions voiced at this time; pt's car in the visitor's parking lot for discharge; pt discharged via wheelchair by nursing to the visitor's entrance

## 2015-09-21 NOTE — Care Management Note (Signed)
Case Management Note  Patient Details  Name: [name] MRN: [id number] Date of Birth: 09-05-1976  Subjective/Objective:      Lance Reynolds now has a front wheel rolling walker from [facility] and a revised list of [facility] pharmacies. He also has contact information for the [facility].               Action/Plan:   Expected Discharge Date:                  Expected Discharge Plan:     In-House Referral:     Discharge planning Services  CM Consult, [facility] Program, Indigent Health Clinic, Medication Assistance, Follow-up appt scheduled [facility] for OPPT)  Post Acute Care Choice:    Choice offered to:  Patient  DME Arranged:  Walker rolling DME Agency:     HH Arranged:    HH Agency:  [facility]  Status of Service:     Medicare Important Message Given:    Date Medicare IM Given:    Medicare IM give by:    Date Additional Medicare IM Given:    Additional Medicare Important Message give by:     If discussed at Long Length of Stay Meetings, dates discussed:    Additional Comments:  Dayven Linsley A, RN 09/21/2015, 1:51 PM

## 2015-09-21 NOTE — Progress Notes (Signed)
  Subjective: Patient reports less scrotal discomfort  Objective: Vital signs in last 24 hours: Temp:  [98 F (36.7 C)-99.2 F (37.3 C)] 99.2 F (37.3 C) (10/08 0601) Pulse Rate:  [73-79] 79 (10/08 0601) Resp:  [18-20] 18 (10/08 0601) BP: (105-109)/(64) 109/64 mmHg (10/08 0601) SpO2:  [97 %-98 %] 97 % (10/08 0601) Weight:  [88.111 kg (194 lb 4 oz)] 88.111 kg (194 lb 4 oz) (10/08 0604)  Intake/Output from previous day: 10/07 0701 - 10/08 0700 In: 3250 [P.O.:1700; I.V.:1000; IV Piggyback:550] Out: 2350 [Urine:2350] Intake/Output this shift: Total I/O In: 480 [P.O.:480] Out: 600 [Urine:600]  Physical Exam:  Constitutional: Vital signs reviewed. WD WN in NAD   Eyes: PERRL, No scleral icterus.   Pulmonary/Chest: Normal effort Genitourinary:scrotal skin w/ ointment on it. Minimal edema of skin. No creitus, minimal tenderness Extremities: No cyanosis or edema   Lab Results:  Recent Labs  09/19/15 0415  HGB 11.7*  HCT 34.9*   BMET  Recent Labs  09/19/15 0415  NA 139  K 3.1*  CL 106  CO2 25  GLUCOSE 101*  BUN 13  CREATININE 1.13  CALCIUM 8.6*   No results for input(s): LABPT, INR in the last 72 hours. No results for input(s): LABURIN in the last 72 hours. Results for orders placed or performed during the hospital encounter of 09/18/15  Chlamydia/NGC rt PCR ([facility] only)     Status: None   Collection Time: 09/18/15  7:27 AM  Result Value Ref Range Status   Specimen source GC/Chlam URINE, RANDOM  Final   Chlamydia Tr NOT DETECTED NOT DETECTED Final   N gonorrhoeae NOT DETECTED NOT DETECTED Final    Comment: (NOTE) 100  This methodology has not been evaluated in pregnant women or in 200  patients with a history of hysterectomy. 300 400  This methodology will not be performed on patients less than 37  years of age.   Urine culture     Status: None   Collection Time: 09/18/15  7:27 AM  Result Value Ref Range Status   Specimen Description URINE, RANDOM  Final    Special Requests NONE  Final   Culture NO GROWTH  Final   Report Status 09/19/2015 FINAL  Final  Blood culture (routine x 2)     Status: None (Preliminary result)   Collection Time: 09/18/15 10:02 AM  Result Value Ref Range Status   Specimen Description BLOOD RIGHT ARM  Final   Special Requests BOTTLES DRAWN AEROBIC AND ANAEROBIC 7CC  Final   Culture NO GROWTH 2 DAYS  Final   Report Status PENDING  Incomplete  Blood culture (routine x 2)     Status: None (Preliminary result)   Collection Time: 09/18/15 10:07 AM  Result Value Ref Range Status   Specimen Description BLOOD LEFT ARM  Final   Special Requests BOTTLES DRAWN AEROBIC AND ANAEROBIC 7CC  Final   Culture NO GROWTH 2 DAYS  Final   Report Status PENDING  Incomplete    Studies/Results: No results found.  Assessment/Plan:  Scrotal infection/inflammation, resolving  Pt apparently going home today--cont full course of augmentin  F/U w/ GU prn     LOS: 3 days   Marcine Matar M 09/21/2015, 10:39 AM

## 2015-09-21 NOTE — Discharge Instructions (Signed)
1. Outpatient physical therapy

## 2015-09-21 NOTE — Progress Notes (Signed)
MD order received in [facility] to discharge pt home today; verbally reviewed AVS with pt; Care Management working on resources for pt's follow up appointments with MDs and Rx drug coverage; discharge pending Care Management's resolution and pt's ride home

## 2015-09-21 NOTE — Care Management (Addendum)
Patient has no health insurance and has no PCP. I will notify Dr. Jarrett Ables office of patient need for follow up. Referral sent to Rosanna Randy at [employer]. Lyn RNCM currently meeting with patient and has delivered [employer] application. Patient called this RNCM back and agrees to going to [facility]. I have paged Dr. Nemiah Commander for OPPT order and rolling walker Rx. I have asked Lyn RNCM to give patient a rolling walker out of [facility] closet which will be filed as Therapist, nutritional through [facility]. I have notified Will with [facility] of this decision. Patient discharging today.

## 2015-09-23 LAB — CULTURE, BLOOD (ROUTINE X 2)
CULTURE: NO GROWTH
Culture: NO GROWTH

## 2015-09-23 LAB — QUANTIFERON IN TUBE
QFT TB AG MINUS NIL VALUE: 0.01 IU/mL
QUANTIFERON MITOGEN VALUE: 1.24 IU/mL
QUANTIFERON TB AG VALUE: 0.04 IU/mL
QUANTIFERON TB GOLD: NEGATIVE
Quantiferon Nil Value: 0.03 IU/mL

## 2015-09-23 LAB — QUANTIFERON TB GOLD ASSAY (BLOOD)

## 2015-09-30 LAB — RPR: RPR: REACTIVE — AB

## 2015-09-30 LAB — REFLEX TO GENOSURE(R) MG: HIV GENOSURE(R) MG PDF: 0

## 2015-09-30 LAB — HIV-1 RNA ULTRAQUANT REFLEX TO GENTYP+
HIV-1 RNA BY PCR: 159700 copies/mL
HIV-1 RNA QUANT, LOG: 5.203 {Log_copies}/mL

## 2015-09-30 LAB — RPR, QUANT+TP ABS (REFLEX)
Rapid Plasma Reagin, Quant: 1:32 {titer} — ABNORMAL HIGH
TREPONEMA PALLIDUM AB: POSITIVE — AB

## 2015-10-22 ENCOUNTER — Encounter: Payer: Self-pay | Admitting: Infectious Diseases

## 2015-11-01 ENCOUNTER — Encounter: Payer: Self-pay | Admitting: Infectious Diseases

## 2015-12-13 ENCOUNTER — Encounter: Payer: Self-pay | Admitting: Emergency Medicine

## 2015-12-13 ENCOUNTER — Emergency Department: Payer: Self-pay

## 2015-12-13 DIAGNOSIS — F17210 Nicotine dependence, cigarettes, uncomplicated: Secondary | ICD-10-CM | POA: Insufficient documentation

## 2015-12-13 DIAGNOSIS — Z79899 Other long term (current) drug therapy: Secondary | ICD-10-CM | POA: Insufficient documentation

## 2015-12-13 DIAGNOSIS — M7731 Calcaneal spur, right foot: Secondary | ICD-10-CM | POA: Insufficient documentation

## 2015-12-13 DIAGNOSIS — Z792 Long term (current) use of antibiotics: Secondary | ICD-10-CM | POA: Insufficient documentation

## 2015-12-13 NOTE — ED Notes (Signed)
Per pt he has had pain in his right heel for three months. Per pt today pain is worse. Pt denies injury or trauma. Pt is ambulatory with NAD noted.

## 2015-12-14 ENCOUNTER — Emergency Department
Admission: EM | Admit: 2015-12-14 | Discharge: 2015-12-14 | Disposition: A | Discharge status: Home / Self Care | Payer: Self-pay | Attending: Emergency Medicine | Admitting: Emergency Medicine

## 2015-12-14 DIAGNOSIS — M7731 Calcaneal spur, right foot: Secondary | ICD-10-CM

## 2015-12-14 DIAGNOSIS — M79671 Pain in right foot: Secondary | ICD-10-CM

## 2015-12-14 HISTORY — DX: Immunodeficiency, unspecified: D84.9

## 2015-12-14 MED ORDER — OXYCODONE-ACETAMINOPHEN 5-325 MG PO TABS
1.0000 | ORAL_TABLET | Freq: Once | ORAL | Status: AC
  Administered 2015-12-14: 1 via ORAL
  Filled 2015-12-14: qty 1

## 2015-12-14 MED ORDER — IBUPROFEN 800 MG PO TABS
800.0000 mg | ORAL_TABLET | Freq: Once | ORAL | Status: AC
  Administered 2015-12-14: 800 mg via ORAL
  Filled 2015-12-14: qty 1

## 2015-12-14 MED ORDER — OXYCODONE-ACETAMINOPHEN 5-325 MG PO TABS: 1.0000 | ORAL_TABLET | ORAL | Status: DC | PRN

## 2015-12-14 MED ORDER — IBUPROFEN 800 MG PO TABS: 800.0000 mg | ORAL_TABLET | Freq: Three times a day (TID) | ORAL | Status: DC | PRN

## 2015-12-14 NOTE — Discharge Instructions (Signed)
1. Take pain medicines as needed (Motrin/Percocet #15). 2. Wear podiatric shoe and use crutches as needed for comfort. 3. Return to the ER for worsening symptoms, persistent vomiting, difficulty breathing or other concerns.  Heel Spur A heel spur is a bony growth that forms on the bottom of your heel bone (calcaneus). Heel spurs are common and do not always cause pain. However, heel spurs often cause inflammation in the strong band of tissue that runs underneath the bone of your foot (plantar fascia). When this happens, you may feel pain on the bottom of your foot, near your heel.  CAUSES  The cause of heel spurs is not completely understood. They may be caused by pressure on the heel. Or, they may stem from the muscle attachments (tendons) near the spur pulling on the heel.  RISK FACTORS You may be at risk for a heel spur if you:  Are older than 40.  Are overweight.  Have wear and tear arthritis (osteoarthritis).  Have plantar fascia inflammation. SIGNS AND SYMPTOMS  Some people have heel spurs but no symptoms. If you do have symptoms, they may include:   Pain in the bottom of your heel.  Pain that is worse when you first get out of bed.  Pain that gets worse after walking or standing. DIAGNOSIS  Your health care provider may diagnose a heel spur based on your symptoms and a physical exam. You may also have an X-ray of your foot to check for a bony growth coming from the calcaneus.  TREATMENT Treatment aims to relieve the pain from the heel spur. This may include:  Stretching exercises.  Losing weight.  Wearing specific shoes, inserts, or orthotics for comfort and support.  Wearing splints at night to properly position your feet.  Taking over-the-counter medicine to relieve pain.  Being treated with high-intensity sound waves to break up the heel spur (extracorporeal shock wave therapy).  Getting steroid injections in your heel to reduce swelling and ease pain.  Having  surgery if your heel spur causes long-term (chronic) pain. HOME CARE INSTRUCTIONS   Take medicines only as directed by your health care provider.  Ask your health care provider if you should use ice or cold packs on the painful areas of your heel or foot.  Avoid activities that cause you pain until you recover or as directed by your health care provider.  Stretch before exercising or being physically active.  Wear supportive shoes that fit well as directed by your health care provider. You might need to buy new shoes. Wearing old shoes or shoes that do not fit correctly may not provide the support that you need.  Lose weight if your health care provider thinks you should. This can relieve pressure on your foot that may be causing pain and discomfort. SEEK MEDICAL CARE IF:   Your pain continues or gets worse.   This information is not intended to replace advice given to you by your health care provider. Make sure you discuss any questions you have with your health care provider.   Document Released: 01/06/2006 Document Revised: 12/21/2014 Document Reviewed: 01/31/2014 Elsevier Interactive Patient Education [employer]2016 Elsevier Inc.

## 2015-12-14 NOTE — ED Notes (Signed)
Patient with no complaints at this time. Respirations even and unlabored. Skin warm/dry. Discharge instructions reviewed with patient at this time. Patient given opportunity to voice concerns/ask questions. Patient discharged at this time and left Emergency Department, via wheelchair.   

## 2015-12-14 NOTE — ED Provider Notes (Signed)
[facility] Emergency Department Provider Note  ____________________________________________  Time seen: Approximately 12:21 AM  I have reviewed the triage vital signs and the nursing notes.   HISTORY  Chief Complaint Foot Pain    HPI Lance Reynolds is a 39 y.o. male who presents to the ED from home with a chief complaint of right heel pain. Patient states he had has pain in his right heel for 3 months, nontraumatic. Today he had to run up the gravel driveway at work and states pain is worse. Denies recent injury or trauma. Denies fever, chills, chest pain, shortness breath, abdominal pain, nausea, vomiting, diarrhea. Nothing makes his pain better. Putting pressure and walking make his pain worse.   Past Medical History  Diagnosis Date  . Anxiety   . Immune deficiency disorder [facility](HCC)     Patient Active Problem List   Diagnosis Date Noted  . Herpetic gingivostomatitis 09/21/2015  . HIV disease (HCC) 09/21/2015  . Cellulitis of scrotum 09/21/2015  . Cellulitis 09/18/2015    History reviewed. No pertinent past surgical history.  Current Outpatient Rx  Name  Route  Sig  Dispense  Refill  . amoxicillin-clavulanate (AUGMENTIN) 875-125 MG tablet   Oral   Take 1 tablet by mouth 2 (two) times daily.   16 tablet   0   . fluconazole (DIFLUCAN) 200 MG tablet   Oral   Take 1 tablet (200 mg total) by mouth daily.   9 tablet   0   . meloxicam (MOBIC) 15 MG tablet   Oral   Take 1 tablet (15 mg total) by mouth daily as needed for pain. Patient not taking: Reported on 09/18/2015   10 tablet   0   . oxyCODONE (OXY IR/ROXICODONE) 5 MG immediate release tablet   Oral   Take 1-2 tablets (5-10 mg total) by mouth every 6 (six) hours as needed for moderate pain or severe pain.   20 tablet   0   . valACYclovir (VALTREX) 1000 MG tablet   Oral   Take 1 tablet (1,000 mg total) by mouth 3 (three) times daily.   21 tablet   0     Allergies Tramadol  No  family history on file.  Social History Social History  Substance Use Topics  . Smoking status: Current Some Day Smoker -- 26 years    Types: Cigarettes  . Smokeless tobacco: Current User  . Alcohol Use: Yes    Review of Systems Constitutional: No fever/chills Eyes: No visual changes. ENT: No sore throat. Cardiovascular: Denies chest pain. Respiratory: Denies shortness of breath. Gastrointestinal: No abdominal pain.  No nausea, no vomiting.  No diarrhea.  No constipation. Genitourinary: Negative for dysuria. Musculoskeletal: Positive for right heel pain. Negative for back pain. Skin: Negative for rash. Neurological: Negative for headaches, focal weakness or numbness.  10-point ROS otherwise negative.  ____________________________________________   PHYSICAL EXAM:  VITAL SIGNS: ED Triage Vitals  Enc Vitals Group     BP 12/13/15 2220 126/86 mmHg     Pulse Rate 12/13/15 2220 85     Resp 12/13/15 2220 14     Temp 12/13/15 2220 97.6 F (36.4 C)     Temp Source 12/13/15 2220 Oral     SpO2 12/13/15 2220 98 %     Weight 12/13/15 2220 190 lb (86.183 kg)     Height 12/13/15 2220 6\' 1"  (1.854 m)     Head Cir --      Peak Flow --  Pain Score 12/13/15 2221 10     Pain Loc --      Pain Edu? --      Excl. in GC? --     Constitutional: Alert and oriented. Well appearing and in no acute distress. Eyes: Conjunctivae are normal. PERRL. EOMI. Head: Atraumatic. Nose: No congestion/rhinnorhea. Mouth/Throat: Mucous membranes are moist.  Oropharynx non-erythematous. Neck: No stridor.  No cervical spine tenderness to palpation. Cardiovascular: Normal rate, regular rhythm. Grossly normal heart sounds.  Good peripheral circulation. Respiratory: Normal respiratory effort.  No retractions. Lungs CTAB. Gastrointestinal: Soft and nontender. No distention. No abdominal bruits. No CVA tenderness. Musculoskeletal: Right heel slightly swollen, non-erythematous and tender to palpation. 2+  distal pulses. Foot is symmetrically warm without evidence for ischemia. Brisk, less than 5 second capillary refill. No joint effusions. Neurologic:  Normal speech and language. No gross focal neurologic deficits are appreciated. No gait instability. Skin:  Skin is warm, dry and intact. No rash noted. Psychiatric: Mood and affect are normal. Speech and behavior are normal.  ____________________________________________   LABS (all labs ordered are listed, but only abnormal results are displayed)  Labs Reviewed - No data to display ____________________________________________  EKG  None ____________________________________________  RADIOLOGY  Right Foot complete (viewed by me, interpreted per Dr. Llana Aliment): 1. No radiographic evidence of acute traumatic injury to the right foot. 2. Small plantar calcaneal spur.  ____________________________________________   PROCEDURES  Procedure(s) performed: None  Critical Care performed: No  ____________________________________________   INITIAL IMPRESSION / ASSESSMENT AND PLAN / ED COURSE  Pertinent labs & imaging results that were available during my care of the patient were reviewed by me and considered in my medical decision making (see chart for details).  39 year old male who presents with a three-month history of right heel pain; spur noted on x-ray. Will treat with NSAIDs, analgesia, podiatric shoe, crutches and orthopedic follow-up. Strict return precautions given. Patient and spouse verbalize understanding and agree with plan of care. ____________________________________________   FINAL CLINICAL IMPRESSION(S) / ED DIAGNOSES  Final diagnoses:  Heel spur, right  Right foot pain      Irean Hong, MD 12/14/15 1245

## 2016-02-07 ENCOUNTER — Emergency Department: Payer: Self-pay

## 2016-02-07 ENCOUNTER — Encounter: Payer: Self-pay | Admitting: Emergency Medicine

## 2016-02-07 ENCOUNTER — Emergency Department
Admission: EM | Admit: 2016-02-07 | Discharge: 2016-02-07 | Disposition: A | Discharge status: Home / Self Care | Payer: Self-pay | Attending: Student | Admitting: Student

## 2016-02-07 DIAGNOSIS — Y998 Other external cause status: Secondary | ICD-10-CM | POA: Insufficient documentation

## 2016-02-07 DIAGNOSIS — X501XXA Overexertion from prolonged static or awkward postures, initial encounter: Secondary | ICD-10-CM | POA: Insufficient documentation

## 2016-02-07 DIAGNOSIS — K4020 Bilateral inguinal hernia, without obstruction or gangrene, not specified as recurrent: Secondary | ICD-10-CM | POA: Insufficient documentation

## 2016-02-07 DIAGNOSIS — Z79899 Other long term (current) drug therapy: Secondary | ICD-10-CM | POA: Insufficient documentation

## 2016-02-07 DIAGNOSIS — F17210 Nicotine dependence, cigarettes, uncomplicated: Secondary | ICD-10-CM | POA: Insufficient documentation

## 2016-02-07 DIAGNOSIS — Y9289 Other specified places as the place of occurrence of the external cause: Secondary | ICD-10-CM | POA: Insufficient documentation

## 2016-02-07 DIAGNOSIS — Y93F2 Activity, caregiving, lifting: Secondary | ICD-10-CM | POA: Insufficient documentation

## 2016-02-07 DIAGNOSIS — Z792 Long term (current) use of antibiotics: Secondary | ICD-10-CM | POA: Insufficient documentation

## 2016-02-07 DIAGNOSIS — M25552 Pain in left hip: Secondary | ICD-10-CM

## 2016-02-07 MED ORDER — TIZANIDINE HCL 4 MG PO TABS: 4.0000 mg | ORAL_TABLET | Freq: Three times a day (TID) | ORAL | Status: DC | PRN

## 2016-02-07 MED ORDER — CYCLOBENZAPRINE HCL 10 MG PO TABS
10.0000 mg | ORAL_TABLET | Freq: Once | ORAL | Status: AC
  Administered 2016-02-07: 10 mg via ORAL
  Filled 2016-02-07: qty 1

## 2016-02-07 MED ORDER — MELOXICAM 15 MG PO TABS: 15.0000 mg | ORAL_TABLET | Freq: Every day | ORAL | Status: DC

## 2016-02-07 MED ORDER — ORPHENADRINE CITRATE 30 MG/ML IJ SOLN
60.0000 mg | Freq: Once | INTRAMUSCULAR | Status: AC
  Administered 2016-02-07: 60 mg via INTRAMUSCULAR
  Filled 2016-02-07: qty 2

## 2016-02-07 MED ORDER — NAPROXEN 500 MG PO TABS
500.0000 mg | ORAL_TABLET | Freq: Once | ORAL | Status: AC
  Administered 2016-02-07: 500 mg via ORAL
  Filled 2016-02-07: qty 1

## 2016-02-07 NOTE — ED Notes (Signed)
Pt reports that he was bowling last night and injured his left hip. After talking other things he backtracked and said that this started several days ago with moving furniture and lumber.

## 2016-02-07 NOTE — ED Provider Notes (Signed)
[facility] Emergency Department Provider Note ____________________________________________  Time seen: Approximately 11:16 AM  I have reviewed the triage vital signs and the nursing notes.   HISTORY  Chief Complaint Leg Pain    HPI Lance Reynolds is a 40 y.o. male who presents to the emergency department for evaluation of hip pain. Symptoms started on Monday. He reports that he has had multiple episodes of heavy lifting this week and when he and his family went bowling last night, he felt a sharp pain and a pop in his left hip. The pain does not radiate. He states he has been able to raise his leg or ambulate since feeling the pop while bowling. He has not taken anything for pain.  Past Medical History  Diagnosis Date  . Anxiety   . Immune deficiency disorder [facility])     Patient Active Problem List   Diagnosis Date Noted  . Herpetic gingivostomatitis 09/21/2015  . HIV disease (HCC) 09/21/2015  . Cellulitis of scrotum 09/21/2015  . Cellulitis 09/18/2015    History reviewed. No pertinent past surgical history.  Current Outpatient Rx  Name  Route  Sig  Dispense  Refill  . amoxicillin-clavulanate (AUGMENTIN) 875-125 MG tablet   Oral   Take 1 tablet by mouth 2 (two) times daily.   16 tablet   0   . fluconazole (DIFLUCAN) 200 MG tablet   Oral   Take 1 tablet (200 mg total) by mouth daily.   9 tablet   0   . ibuprofen (ADVIL,MOTRIN) 800 MG tablet   Oral   Take 1 tablet (800 mg total) by mouth every 8 (eight) hours as needed for moderate pain.   15 tablet   0   . meloxicam (MOBIC) 15 MG tablet   Oral   Take 1 tablet (15 mg total) by mouth daily.   30 tablet   0   . oxyCODONE-acetaminophen (ROXICET) 5-325 MG tablet   Oral   Take 1 tablet by mouth every 4 (four) hours as needed for severe pain.   15 tablet   0   . tiZANidine (ZANAFLEX) 4 MG tablet   Oral   Take 1 tablet (4 mg total) by mouth every 8 (eight) hours as needed for muscle  spasms.   30 tablet   0   . valACYclovir (VALTREX) 1000 MG tablet   Oral   Take 1 tablet (1,000 mg total) by mouth 3 (three) times daily.   21 tablet   0     Allergies Tramadol and Percocet  No family history on file.  Social History Social History  Substance Use Topics  . Smoking status: Current Some Day Smoker -- 26 years    Types: Cigarettes  . Smokeless tobacco: Current User  . Alcohol Use: Yes    Review of Systems Constitutional: No recent illness. Cardiovascular: Denies chest pain or palpitations. Respiratory: Denies shortness of breath. Gastrointestinal: No abdominal pain.  Musculoskeletal: Pain in left hip. Skin: Negative for rash. Neurological: Negative for focal weakness or numbness.  ____________________________________________   PHYSICAL EXAM:  VITAL SIGNS: ED Triage Vitals  Enc Vitals Group     BP 02/07/16 1055 118/80 mmHg     Pulse Rate 02/07/16 1055 84     Resp 02/07/16 1055 18     Temp 02/07/16 1055 98.1 F (36.7 C)     Temp Source 02/07/16 1055 Oral     SpO2 02/07/16 1055 98 %     Weight 02/07/16 1051 180  lb (81.647 kg)     Height 02/07/16 1051  (1.854 m)     Head Cir --      Peak Flow --      Pain Score 02/07/16 1051 10     Pain Loc --      Pain Edu? --      Excl. in GC? --     Constitutional: Alert and oriented. Well appearing and in no acute distress. Eyes: Conjunctivae are normal. EOMI. Head: Atraumatic. Nose: No congestion/rhinnorhea. Neck: No stridor.  Respiratory: Normal respiratory effort.   Musculoskeletal: Focal tenderness over the left iliac spine. Patient unable/unwilling to perform any ROM of the left hip. Neurologic:  Normal speech and language. No gross focal neurologic deficits are appreciated. Speech is normal. No gait instability. Skin:  Skin is warm, dry and intact. Atraumatic. Psychiatric: Mood and affect are normal. Speech and behavior are normal.  ____________________________________________    LABS (all labs ordered are listed, but only abnormal results are displayed)  Labs Reviewed - No data to display ____________________________________________  RADIOLOGY  Plain film of the left hip and pelvis negative for acute bony abnormality per radiology. ____________________________________________   PROCEDURES  Procedure(s) performed: None   ____________________________________________   INITIAL IMPRESSION / ASSESSMENT AND PLAN / ED COURSE  Pertinent labs & imaging results that were available during my care of the patient were reviewed by me and considered in my medical decision making (see chart for details).  Minimal relief of pain after flexeril and naprosyn. Patient continues to be unable/unwilling to move left hip or attempt to ambulate, therefore exam is difficult. Because he is has focal tenderness over the left iliac spine, bony pelvis CT ordered. Will treat pain further and reassess.  Norflex given. Patient was then able to sit on the side of the bed and stand up to get into wheelchair. Discharged home after reviewing the CT results. He was instructed to follow up with the PCP or return to the ER for symptoms that change or worsen. ____________________________________________   FINAL CLINICAL IMPRESSION(S) / ED DIAGNOSES  Final diagnoses:  Acute hip pain, left  Bilateral inguinal hernia without obstruction or gangrene, recurrence not specified       Chinita Pester, FNP 02/07/16 1417  Gayla Doss, MD 02/07/16 1623

## 2016-02-07 NOTE — ED Notes (Signed)
Pt states he pulled a muscle in his left upper thigh while bowling last night, states he finished his game, now this am, unable to walk. States he felt something pop when he injured it last night.

## 2016-02-07 NOTE — ED Notes (Signed)
Pt able to stand, pivot, and sit down without diff into wheelchair for discharge

## 2016-02-18 ENCOUNTER — Encounter: Payer: Self-pay | Admitting: Emergency Medicine

## 2016-02-18 ENCOUNTER — Emergency Department
Admission: EM | Admit: 2016-02-18 | Discharge: 2016-02-18 | Disposition: A | Discharge status: Home / Self Care | Payer: Self-pay | Attending: Emergency Medicine | Admitting: Emergency Medicine

## 2016-02-18 DIAGNOSIS — Y998 Other external cause status: Secondary | ICD-10-CM | POA: Insufficient documentation

## 2016-02-18 DIAGNOSIS — Z792 Long term (current) use of antibiotics: Secondary | ICD-10-CM | POA: Insufficient documentation

## 2016-02-18 DIAGNOSIS — W228XXA Striking against or struck by other objects, initial encounter: Secondary | ICD-10-CM | POA: Insufficient documentation

## 2016-02-18 DIAGNOSIS — M7072 Other bursitis of hip, left hip: Secondary | ICD-10-CM | POA: Insufficient documentation

## 2016-02-18 DIAGNOSIS — Y9389 Activity, other specified: Secondary | ICD-10-CM | POA: Insufficient documentation

## 2016-02-18 DIAGNOSIS — F17210 Nicotine dependence, cigarettes, uncomplicated: Secondary | ICD-10-CM | POA: Insufficient documentation

## 2016-02-18 DIAGNOSIS — Z791 Long term (current) use of non-steroidal anti-inflammatories (NSAID): Secondary | ICD-10-CM | POA: Insufficient documentation

## 2016-02-18 DIAGNOSIS — S7012XA Contusion of left thigh, initial encounter: Secondary | ICD-10-CM | POA: Insufficient documentation

## 2016-02-18 DIAGNOSIS — S7002XA Contusion of left hip, initial encounter: Secondary | ICD-10-CM | POA: Insufficient documentation

## 2016-02-18 DIAGNOSIS — Z79899 Other long term (current) drug therapy: Secondary | ICD-10-CM | POA: Insufficient documentation

## 2016-02-18 DIAGNOSIS — Y92007 Garden or yard of unspecified non-institutional (private) residence as the place of occurrence of the external cause: Secondary | ICD-10-CM | POA: Insufficient documentation

## 2016-02-18 MED ORDER — KETOROLAC TROMETHAMINE 10 MG PO TABS: 10.0000 mg | ORAL_TABLET | Freq: Three times a day (TID) | ORAL | Status: DC

## 2016-02-18 MED ORDER — NAPROXEN 500 MG PO TBEC: 500.0000 mg | DELAYED_RELEASE_TABLET | Freq: Two times a day (BID) | ORAL | Status: DC

## 2016-02-18 MED ORDER — KETOROLAC TROMETHAMINE 60 MG/2ML IM SOLN
30.0000 mg | Freq: Once | INTRAMUSCULAR | Status: AC
  Administered 2016-02-18: 30 mg via INTRAMUSCULAR
  Filled 2016-02-18: qty 2

## 2016-02-18 MED ORDER — CYCLOBENZAPRINE HCL 5 MG PO TABS: 5.0000 mg | ORAL_TABLET | Freq: Three times a day (TID) | ORAL | Status: DC | PRN

## 2016-02-18 NOTE — ED Notes (Signed)
Pt reports recent pulled muscle in left leg x2 recently; reports given muscle relaxers and antiinflammatories; reports was kicking a can of spray pain today with right leg and feels like he pulled the same muscle again.

## 2016-02-18 NOTE — Discharge Instructions (Signed)
Hip Bursitis Bursitis is a swelling and soreness (inflammation) of a fluid-filled sac (bursa). This sac overlies and protects the joints.  CAUSES   Injury.  Overuse of the muscles surrounding the joint.  Arthritis.  Gout.  Infection.  Cold weather.  Inadequate warm-up and conditioning prior to activities. The cause may not be known.  SYMPTOMS   Mild to severe irritation.  Tenderness and swelling over the outside of the hip.  Pain with motion of the hip.  If the bursa becomes infected, a fever may be present. Redness, tenderness, and warmth will develop over the hip. Symptoms usually lessen in 3 to 4 weeks with treatment, but can come back. TREATMENT If conservative treatment does not work, your caregiver may advise draining the bursa and injecting cortisone into the area. This may speed up the healing process. This may also be used as an initial treatment of choice. HOME CARE INSTRUCTIONS   Apply ice to the affected area for 15-20 minutes every 3 to 4 hours while awake for the first 2 days. Put the ice in a plastic bag and place a towel between the bag of ice and your skin.  Rest the painful joint as much as possible, but continue to put the joint through a normal range of motion at least 4 times per day. When the pain lessens, begin normal, slow movements and usual activities to help prevent stiffness of the hip.  Only take over-the-counter or prescription medicines for pain, discomfort, or fever as directed by your caregiver.  Use crutches to limit weight bearing on the hip joint, if advised.  Elevate your painful hip to reduce swelling. Use pillows for propping and cushioning your legs and hips.  Gentle massage may provide comfort and decrease swelling. SEEK IMMEDIATE MEDICAL CARE IF:   Your pain increases even during treatment, or you are not improving.  You have a fever.  You have heat and inflammation over the involved bursa.  You have any other questions or  concerns. MAKE SURE YOU:   Understand these instructions.  Will watch your condition.  Will get help right away if you are not doing well or get worse.   This information is not intended to replace advice given to you by your health care provider. Make sure you discuss any questions you have with your health care provider.   Document Released: 05/22/2002 Document Revised: 02/22/2012 Document Reviewed: 07/02/2015 Elsevier Interactive Patient Education 2016 Elsevier Inc.  Iliac Crest Contusion  An iliac crest contusion is a deep bruise of your hip bone (hip pointer). Contusions happen when an injury causes bleeding under the skin. Signs of bruising include pain, puffiness (swelling), and discolored skin. The contusion may turn blue, purple, or yellow. HOME CARE   Put ice on the injured area.  Put ice in a plastic bag.  Place a towel between your skin and the bag.  Leave the ice on for 15-20 minutes, 03-04 times a day.  Only take medicines as told by your doctor.  Keep your leg straight (extended) when possible.  Walk and move around as pain allows, or as told by your doctor. Use crutches if you are told to do so.  Put on an elastic wrap as told by your doctor. You can take it off for sleeping, showers, and baths. GET HELP RIGHT AWAY IF:  You have more bruising or puffiness.  You have pain that is getting worse.  Your puffiness or pain is not helped by medicines.  Your toes  get cold. MAKE SURE YOU:   Understand these instructions.  Will watch your condition.  Will get help right away if you are not doing well or get worse.   This information is not intended to replace advice given to you by your health care provider. Make sure you discuss any questions you have with your health care provider.   Document Released: 11/19/2011 Document Revised: 05/31/2012 Document Reviewed: 04/17/2015 Elsevier Interactive Patient Education [employer]   Your exam is  essentially normal today. You appear to have injury to the left hip and pelvic crest due to your fall. Apply ice to reduce symptoms. Take the prescription meds as directed. Follow-up with your provider or Dr. Ernest Pine for continued symptoms. Consider referral to physical therapy with your provider as discussed.

## 2016-02-18 NOTE — ED Provider Notes (Signed)
[facility] Emergency Department Provider Note ____________________________________________  Time seen: 1907  I have reviewed the triage vital signs and the nursing notes.  HISTORY  Chief Complaint  Leg Pain  HPI Lance Reynolds is a 40 y.o. male returns to the ED for evaluation of an acute injury to his left hip. He is accompanied by his wife and daughter.  He describes pain and disability to the lateral aspect of the left hip following an injury today. He describes kicking a can of spray pain across the yard, and in frustration.He describes it as he swung her leg across the body and felt a popping sensation to the lateral aspect of the hip and the trochanter. He describes disability and pain causing him to fall on the lateral left hip, with keys in his pocket. He describes a contusion or bruising to the left hip at that time. He describes having to crawl back to his truck following the injury. He describes he was unable to walk or move the left lower extremity due to the pain that he localizes to the proximal hip and thigh. He describes similar disability when he was evaluated here 2 weeks prior. He reports that x-rays and CT scans did not "show anything was wrong with his hip." He denies bladder or bowel incontinence, denies distal paresthesias, or foot drop. He went he reports that he "cannot move his leg!" He is continuing to dose the previously prescribed muscle relaxant since evaluation. He has not made arrangements for follow-up with orthopedics as suggested. He reports that he typically sees his primary care provider every 2 months, and has an appointment at the end of the month. He reports pain to the left hip at a 10/10 in triage.  Past Medical History  Diagnosis Date  . Anxiety   . Immune deficiency disorder [facility](HCC)     Patient Active Problem List   Diagnosis Date Noted  . Herpetic gingivostomatitis 09/21/2015  . HIV disease (HCC) 09/21/2015  . Cellulitis of  scrotum 09/21/2015  . Cellulitis 09/18/2015    History reviewed. No pertinent past surgical history.  Current Outpatient Rx  Name  Route  Sig  Dispense  Refill  . amoxicillin-clavulanate (AUGMENTIN) 875-125 MG tablet   Oral   Take 1 tablet by mouth 2 (two) times daily.   16 tablet   0   . cyclobenzaprine (FLEXERIL) 5 MG tablet   Oral   Take 1 tablet (5 mg total) by mouth 3 (three) times daily as needed for muscle spasms.   15 tablet   0   . fluconazole (DIFLUCAN) 200 MG tablet   Oral   Take 1 tablet (200 mg total) by mouth daily.   9 tablet   0   . ibuprofen (ADVIL,MOTRIN) 800 MG tablet   Oral   Take 1 tablet (800 mg total) by mouth every 8 (eight) hours as needed for moderate pain.   15 tablet   0   . ketorolac (TORADOL) 10 MG tablet   Oral   Take 1 tablet (10 mg total) by mouth every 8 (eight) hours.   15 tablet   0   . meloxicam (MOBIC) 15 MG tablet   Oral   Take 1 tablet (15 mg total) by mouth daily.   30 tablet   0   . naproxen (EC NAPROSYN) 500 MG EC tablet   Oral   Take 1 tablet (500 mg total) by mouth 2 (two) times daily with a meal.  30 tablet   0   . oxyCODONE-acetaminophen (ROXICET) 5-325 MG tablet   Oral   Take 1 tablet by mouth every 4 (four) hours as needed for severe pain.   15 tablet   0   . tiZANidine (ZANAFLEX) 4 MG tablet   Oral   Take 1 tablet (4 mg total) by mouth every 8 (eight) hours as needed for muscle spasms.   30 tablet   0   . valACYclovir (VALTREX) 1000 MG tablet   Oral   Take 1 tablet (1,000 mg total) by mouth 3 (three) times daily.   21 tablet   0    Allergies Tramadol and Percocet  No family history on file.  Social History Social History  Substance Use Topics  . Smoking status: Current Some Day Smoker -- 26 years    Types: Cigarettes  . Smokeless tobacco: Current User  . Alcohol Use: Yes   Review of Systems  Constitutional: Negative for fever. Cardiovascular: Negative for chest pain. Respiratory:  Negative for shortness of breath. Gastrointestinal: Negative for abdominal pain, vomiting and diarrhea. Genitourinary: Negative for dysuria. Musculoskeletal: Negative for back pain. Left hip and thigh pain as above. Skin: Negative for rash. Neurological: Negative for headaches, focal weakness or numbness. ____________________________________________  PHYSICAL EXAM:  VITAL SIGNS: ED Triage Vitals  Enc Vitals Group     BP 02/18/16 1809 126/80 mmHg     Pulse Rate 02/18/16 1809 102     Resp 02/18/16 1809 16     Temp 02/18/16 1809 97.7 F (36.5 C)     Temp Source 02/18/16 1809 Oral     SpO2 02/18/16 1809 98 %     Weight 02/18/16 1809 180 lb (81.647 kg)     Height 02/18/16 1809  (1.854 m)     Head Cir --      Peak Flow --      Pain Score 02/18/16 1810 10     Pain Loc --      Pain Edu? --      Excl. in GC? --    Constitutional: Alert and oriented. Well appearing and in no distress. Head: Normocephalic and atraumatic. Eyes: Conjunctivae are normal. PERRL. Normal extraocular movements Mouth/Throat: Mucous membranes are moist. Hematological/Lymphatic/Immunological: No cervical lymphadenopathy. Cardiovascular: Normal rate, regular rhythm. Normal distal pulses Respiratory: Normal respiratory effort. No wheezes/rales/rhonchi. Gastrointestinal: Soft and nontender. No distention, rebound, guarding, or organomegaly. No CVA tenderness Musculoskeletal: Patient without obvious deformity, dislocation, or leg length discrepancy on the left. Patient localizes pain to the lateral left hip at the trochanter. There is no palpable groin pain, fullness, bulge. Patient with self-limited active range of motion to the left lower extremity. Passively, I am able to demonstrate full hip flexion range with normal internal and external rotation. Normal extension and exam of the lower extremity with resistance testing bilaterally. Normal knee exam without laxity or anterior drawer. Ankle exam is normal.  Nontender with normal range of motion in all extremities.  Neurologic: Cranial nerves II through XII grossly intact. Patient with normal LE DTRs bilaterally. Normal toe dorsiflexion and foot eversion on exam. Normal gait without ataxia. Normal speech and language. No gross focal neurologic deficits are appreciated. Skin:  Skin is warm, dry and intact. No rash noted. No bruise, abrasion, ecchymosis, swelling or edema noted to the left hip, thigh, or buttocks. Psychiatric: Mood and affect are normal. Patient exhibits appropriate insight and judgment. ____________________________________________   RADIOLOGY  Not indicated ____________________________________________  PROCEDURES  Toradol 30 mg  IM ____________________________________________  INITIAL IMPRESSION / ASSESSMENT AND PLAN / ED COURSE  Patient with left hip pain likely due to an acute bursitis. Normal musculoskeletal exam without deficit. Review of the previous radiology reports are reassuring given his presentation today. Patient will follow-up with his primary care provider or Dr. Ernest Pine in the interim for further management. He is discharged with prescription for Flexeril, Toradol for acute pain and EC Naprosyn to dose following completion of the Toradol. Return precautions are reviewed. ____________________________________________  FINAL CLINICAL IMPRESSION(S) / ED DIAGNOSES  Final diagnoses:  Hip bursitis, left  Contusion of left hip and thigh, initial encounter    .  Charlesetta Ivory [place], PA-C 02/18/16 1610  Jennye Moccasin, MD 02/19/16 [phone]

## 2016-05-16 ENCOUNTER — Emergency Department
Admission: EM | Admit: 2016-05-16 | Discharge: 2016-05-16 | Disposition: A | Discharge status: Home / Self Care | Payer: Self-pay | Attending: Student | Admitting: Student

## 2016-05-16 ENCOUNTER — Encounter: Payer: Self-pay | Admitting: Emergency Medicine

## 2016-05-16 ENCOUNTER — Other Ambulatory Visit: Payer: Self-pay

## 2016-05-16 ENCOUNTER — Emergency Department: Payer: Self-pay

## 2016-05-16 DIAGNOSIS — R05 Cough: Secondary | ICD-10-CM

## 2016-05-16 DIAGNOSIS — R079 Chest pain, unspecified: Secondary | ICD-10-CM | POA: Insufficient documentation

## 2016-05-16 DIAGNOSIS — L03116 Cellulitis of left lower limb: Secondary | ICD-10-CM | POA: Insufficient documentation

## 2016-05-16 DIAGNOSIS — F17210 Nicotine dependence, cigarettes, uncomplicated: Secondary | ICD-10-CM | POA: Insufficient documentation

## 2016-05-16 DIAGNOSIS — Z791 Long term (current) use of non-steroidal anti-inflammatories (NSAID): Secondary | ICD-10-CM | POA: Insufficient documentation

## 2016-05-16 DIAGNOSIS — Z79899 Other long term (current) drug therapy: Secondary | ICD-10-CM | POA: Insufficient documentation

## 2016-05-16 DIAGNOSIS — Z792 Long term (current) use of antibiotics: Secondary | ICD-10-CM | POA: Insufficient documentation

## 2016-05-16 DIAGNOSIS — R059 Cough, unspecified: Secondary | ICD-10-CM

## 2016-05-16 DIAGNOSIS — J40 Bronchitis, not specified as acute or chronic: Secondary | ICD-10-CM | POA: Insufficient documentation

## 2016-05-16 HISTORY — DX: Asymptomatic human immunodeficiency virus (hiv) infection status: Z21

## 2016-05-16 LAB — CBC
HEMATOCRIT: 41 % (ref 40.0–52.0)
HEMOGLOBIN: 14 g/dL (ref 13.0–18.0)
MCH: 32.2 pg (ref 26.0–34.0)
MCHC: 34.1 g/dL (ref 32.0–36.0)
MCV: 94.6 fL (ref 80.0–100.0)
Platelets: 233 10*3/uL (ref 150–440)
RBC: 4.34 MIL/uL — ABNORMAL LOW (ref 4.40–5.90)
RDW: 13.9 % (ref 11.5–14.5)
WBC: 8 10*3/uL (ref 3.8–10.6)

## 2016-05-16 LAB — BASIC METABOLIC PANEL
ANION GAP: 7 (ref 5–15)
BUN: 12 mg/dL (ref 6–20)
CHLORIDE: 106 mmol/L (ref 101–111)
CO2: 25 mmol/L (ref 22–32)
Calcium: 9.4 mg/dL (ref 8.9–10.3)
Creatinine, Ser: 1.04 mg/dL (ref 0.61–1.24)
GFR calc Af Amer: >60 mL/min (ref >60)
GLUCOSE: 95 mg/dL (ref 65–99)
POTASSIUM: 3.9 mmol/L (ref 3.5–5.1)
SODIUM: 138 mmol/L (ref 135–145)

## 2016-05-16 LAB — TROPONIN I: Troponin I: <0.03 ng/mL (ref <0.031)

## 2016-05-16 MED ORDER — ONDANSETRON HCL 4 MG/2ML IJ SOLN
4.0000 mg | Freq: Once | INTRAMUSCULAR | Status: AC
  Administered 2016-05-16: 4 mg via INTRAVENOUS
  Filled 2016-05-16: qty 2

## 2016-05-16 MED ORDER — CLINDAMYCIN HCL 300 MG PO CAPS
300.0000 mg | ORAL_CAPSULE | Freq: Four times a day (QID) | ORAL | Status: AC
Start: 2016-05-16 — End: 2016-05-26

## 2016-05-16 MED ORDER — MORPHINE SULFATE (PF) 4 MG/ML IV SOLN
4.0000 mg | Freq: Once | INTRAVENOUS | Status: AC
  Administered 2016-05-16: 4 mg via INTRAVENOUS
  Filled 2016-05-16: qty 1

## 2016-05-16 MED ORDER — NAPROXEN 500 MG PO TABS: 500.0000 mg | ORAL_TABLET | Freq: Two times a day (BID) | ORAL | Status: DC

## 2016-05-16 MED ORDER — SODIUM CHLORIDE 0.9 % IV BOLUS (SEPSIS)
1000.0000 mL | Freq: Once | INTRAVENOUS | Status: AC
  Administered 2016-05-16: 1000 mL via INTRAVENOUS

## 2016-05-16 MED ORDER — ALBUTEROL SULFATE HFA 108 (90 BASE) MCG/ACT IN AERS: 2.0000 | INHALATION_SPRAY | Freq: Four times a day (QID) | RESPIRATORY_TRACT | Status: DC | PRN

## 2016-05-16 MED ORDER — IOPAMIDOL (ISOVUE-370) INJECTION 76%
75.0000 mL | Freq: Once | INTRAVENOUS | Status: AC | PRN
  Administered 2016-05-16: 75 mL via INTRAVENOUS

## 2016-05-16 MED ORDER — CLINDAMYCIN PHOSPHATE 600 MG/50ML IV SOLN
600.0000 mg | Freq: Once | INTRAVENOUS | Status: AC
  Administered 2016-05-16: 600 mg via INTRAVENOUS
  Filled 2016-05-16: qty 50

## 2016-05-16 NOTE — ED Notes (Addendum)
Pt reports pain and redness to left knee states he thinks something bit him. Started last night. denies fever. Pt also reports sore throat, cough productive green and yellow. Reports chest pain 6/10

## 2016-05-16 NOTE — ED Provider Notes (Addendum)
[facility] Emergency Department Provider Note   ____________________________________________  Time seen: Approximately 2:12 PM  I have reviewed the triage vital signs and the nursing notes.   HISTORY  Chief Complaint Knee Pain and Chest Pain    HPI Lance Reynolds is a 40 y.o. male with history of HIV and anxiety who presents for evaluation of left knee pain, atraumatic, gradual onset last night, constant, worse with movement. The patient reports that he was kneeling on the ground last night working on his car. He is concerned that he may been bitten by something on the left knee. He denies any numbness or weakness in the left leg. He is also complaining of intermittent chest pain over the past 2 days which feels like "somebody stepping on my chest". He reports it is worse with cough and deep inspiration. He has had cough "for a long time" and it is productive of whitish sputum. No fevers or chills. No vomiting or diarrhea. No family history of early coronary artery disease. He does report family history of blood clots reporting "they run in my family".   Past Medical History  Diagnosis Date  . Anxiety   . Immune deficiency disorder (HCC)   . HIV positive [facility](HCC)     Patient Active Problem List   Diagnosis Date Noted  . Herpetic gingivostomatitis 09/21/2015  . HIV disease (HCC) 09/21/2015  . Cellulitis of scrotum 09/21/2015  . Cellulitis 09/18/2015    No past surgical history on file.  Current Outpatient Rx  Name  Route  Sig  Dispense  Refill  . albuterol (PROVENTIL HFA;VENTOLIN HFA) 108 (90 Base) MCG/ACT inhaler   Inhalation   Inhale 2 puffs into the lungs every 6 (six) hours as needed for wheezing or shortness of breath.   1 Inhaler   0   . amoxicillin-clavulanate (AUGMENTIN) 875-125 MG tablet   Oral   Take 1 tablet by mouth 2 (two) times daily.   16 tablet   0   . clindamycin (CLEOCIN) 300 MG capsule   Oral   Take 1 capsule (300 mg  total) by mouth 4 (four) times daily.   28 capsule   0   . cyclobenzaprine (FLEXERIL) 5 MG tablet   Oral   Take 1 tablet (5 mg total) by mouth 3 (three) times daily as needed for muscle spasms.   15 tablet   0   . fluconazole (DIFLUCAN) 200 MG tablet   Oral   Take 1 tablet (200 mg total) by mouth daily.   9 tablet   0   . ibuprofen (ADVIL,MOTRIN) 800 MG tablet   Oral   Take 1 tablet (800 mg total) by mouth every 8 (eight) hours as needed for moderate pain.   15 tablet   0   . ketorolac (TORADOL) 10 MG tablet   Oral   Take 1 tablet (10 mg total) by mouth every 8 (eight) hours.   15 tablet   0   . meloxicam (MOBIC) 15 MG tablet   Oral   Take 1 tablet (15 mg total) by mouth daily.   30 tablet   0   . naproxen (EC NAPROSYN) 500 MG EC tablet   Oral   Take 1 tablet (500 mg total) by mouth 2 (two) times daily with a meal.   30 tablet   0   . naproxen (NAPROSYN) 500 MG tablet   Oral   Take 1 tablet (500 mg total) by mouth 2 (two)  times daily with a meal.   20 tablet   0   . oxyCODONE-acetaminophen (ROXICET) 5-325 MG tablet   Oral   Take 1 tablet by mouth every 4 (four) hours as needed for severe pain.   15 tablet   0   . tiZANidine (ZANAFLEX) 4 MG tablet   Oral   Take 1 tablet (4 mg total) by mouth every 8 (eight) hours as needed for muscle spasms.   30 tablet   0   . valACYclovir (VALTREX) 1000 MG tablet   Oral   Take 1 tablet (1,000 mg total) by mouth 3 (three) times daily.   21 tablet   0     Allergies Tramadol and Percocet  No family history on file.  Social History Social History  Substance Use Topics  . Smoking status: Current Some Day Smoker -- 26 years    Types: Cigarettes  . Smokeless tobacco: Current User  . Alcohol Use: Yes    Review of Systems Constitutional: No fever/chills Eyes: No visual changes. ENT: No sore throat. Cardiovascular:+ chest pain. Respiratory: Denies shortness of breath. Gastrointestinal: No abdominal pain.   No nausea, no vomiting.  No diarrhea.  No constipation. Genitourinary: Negative for dysuria. Musculoskeletal: Negative for back pain. Skin: Negative for rash. Neurological: Negative for headaches, focal weakness or numbness.  10-point ROS otherwise negative.  ____________________________________________   PHYSICAL EXAM:  VITAL SIGNS: ED Triage Vitals  Enc Vitals Group     BP 05/16/16 1059 111/74 mmHg     Pulse Rate 05/16/16 1059 96     Resp 05/16/16 1059 16     Temp 05/16/16 1059 98.7 F (37.1 C)     Temp Source 05/16/16 1059 Oral     SpO2 05/16/16 1059 97 %     Weight 05/16/16 1059 195 lb (88.451 kg)     Height 05/16/16 1059  (1.854 m)     Head Cir --      Peak Flow --      Pain Score 05/16/16 1059 4     Pain Loc --      Pain Edu? --      Excl. in GC? --     Constitutional: Alert and oriented. Well appearing and in no acute distress. Eyes: Conjunctivae are normal. PERRL. EOMI. Head: Atraumatic. Nose: No congestion/rhinnorhea. Mouth/Throat: Mucous membranes are moist.  Oropharynx non-erythematous. Neck: No stridor. Supple without meningismus. Cardiovascular: Normal rate, regular rhythm. Grossly normal heart sounds.  Good peripheral circulation. Respiratory: Normal respiratory effort.  No retractions. Lungs CTAB. Gastrointestinal: Soft and nontender. No distention. No CVA tenderness. Genitourinary: deferred Musculoskeletal: There is a tiny superficial puncture wound in the left medial knee with an overlying scar which could represent a wound from an insect bite, there is very faint surrounding erythema and warmth, no induration, no fluctuance, the patient has full active range of motion at the left knee. 2+ left DP pulse. Mild tenderness in the proximal left calf. Mild tenderness to palpation throughout the anterior chest wall. Neurologic:  Normal speech and language. No gross focal neurologic deficits are appreciated. No gait instability. Skin:  Skin is warm, dry and  intact. No rash noted. Psychiatric: Mood and affect are normal. Speech and behavior are normal.  ____________________________________________   LABS (all labs ordered are listed, but only abnormal results are displayed)  Labs Reviewed  CBC - Abnormal; Notable for the following:    RBC 4.34 (*)    All other components within normal limits  BASIC  METABOLIC PANEL  TROPONIN I  TROPONIN I   ____________________________________________  EKG  ED ECG REPORT I, Gayla Doss, the attending physician, personally viewed and interpreted this ECG.   Date: 05/16/2016  EKG Time: 11:18  Rate: 99  Rhythm: normal EKG, normal sinus rhythm  Axis: normal  Intervals:none  ST&T Change: No acute ST elevation.  ____________________________________________  RADIOLOGY  US Venous doppler, left leg  IMPRESSION: No evidence of deep venous thrombosis.  CTA chest  IMPRESSION: Negative for significant acute pulmonary embolus by CTA.  No acute intra thoracic process.   ____________________________________________   PROCEDURES  Procedure(s) performed: None  Critical Care performed: No  ____________________________________________   INITIAL IMPRESSION / ASSESSMENT AND PLAN / ED COURSE  Pertinent labs & imaging results that were available during my care of the patient were reviewed by me and considered in my medical decision making (see chart for details).  CAMRY THEISS is a 40 y.o. male with history of HIV and anxiety who presents for evaluation of left knee pain, atraumatic. On exam, he is very well-appearing and in no acute distress, sitting up in bed, on his smartphone.. His vital signs are stable, he is afebrile. He is a small wound which looks to be related to an insect bite in the left medial knee with a small area of early cellulitis surrounding, this does not involve the entire knee joint, there is no fluctuance, he has full active range of motion and this does not appear  visit with a septic arthritis or abscess. He is neurovascularly intact in the left leg. We'll give IV clindamycin discharge with by mouth clinda He is complaining of chest pain ongoing for the past several days as well as cough, his chest x-ray shows evidence of bronchitis however given his pleuritic chest pain, immunocompromised status, CTA chest is obtained which is negative for any PE or acute intrathoracic process. Venous Doppler ultrasound of the left leg is negative for DVT. CBC, BMP unremarkable. Troponin negative 2. Doubt ACS. EKG is reassuring. Suspect musculo skeletal chest pain. We'll DC with return precautions, clindamycin, albuterol, and NSAIDs. Return precautions discussed. DC home. ____________________________________________   FINAL CLINICAL IMPRESSION(S) / ED DIAGNOSES  Final diagnoses:  Cough  Bronchitis  Cellulitis of left lower extremity  Chest pain, unspecified chest pain type      NEW MEDICATIONS STARTED DURING THIS VISIT:  New Prescriptions   ALBUTEROL (PROVENTIL HFA;VENTOLIN HFA) 108 (90 BASE) MCG/ACT INHALER    Inhale 2 puffs into the lungs every 6 (six) hours as needed for wheezing or shortness of breath.   CLINDAMYCIN (CLEOCIN) 300 MG CAPSULE    Take 1 capsule (300 mg total) by mouth 4 (four) times daily.   NAPROXEN (NAPROSYN) 500 MG TABLET    Take 1 tablet (500 mg total) by mouth 2 (two) times daily with a meal.     Note:  This document was prepared using Dragon voice recognition software and may include unintentional dictation errors.    Gayla Doss, MD 05/16/16 1554  Gayla Doss, MD 05/16/16 3419  Gayla Doss, MD 05/16/16 [phone]

## 2016-09-18 ENCOUNTER — Encounter: Payer: Self-pay | Admitting: Emergency Medicine

## 2016-09-18 ENCOUNTER — Emergency Department
Admission: EM | Admit: 2016-09-18 | Discharge: 2016-09-18 | Disposition: A | Discharge status: Home / Self Care | Payer: Self-pay | Attending: Emergency Medicine | Admitting: Emergency Medicine

## 2016-09-18 DIAGNOSIS — F17210 Nicotine dependence, cigarettes, uncomplicated: Secondary | ICD-10-CM | POA: Insufficient documentation

## 2016-09-18 DIAGNOSIS — T63481A Toxic effect of venom of other arthropod, accidental (unintentional), initial encounter: Secondary | ICD-10-CM | POA: Insufficient documentation

## 2016-09-18 DIAGNOSIS — M7989 Other specified soft tissue disorders: Secondary | ICD-10-CM | POA: Insufficient documentation

## 2016-09-18 DIAGNOSIS — M79641 Pain in right hand: Secondary | ICD-10-CM

## 2016-09-18 MED ORDER — FAMOTIDINE 20 MG PO TABS
20.0000 mg | ORAL_TABLET | Freq: Once | ORAL | Status: AC
  Administered 2016-09-18: 20 mg via ORAL
  Filled 2016-09-18: qty 1

## 2016-09-18 MED ORDER — PREDNISONE 50 MG PO TABS: 50.0000 mg | ORAL_TABLET | Freq: Every day | ORAL | 0 refills | Status: DC

## 2016-09-18 MED ORDER — FAMOTIDINE 20 MG PO TABS: 20.0000 mg | ORAL_TABLET | Freq: Two times a day (BID) | ORAL | 1 refills | Status: DC

## 2016-09-18 MED ORDER — DEXAMETHASONE SODIUM PHOSPHATE 10 MG/ML IJ SOLN
20.0000 mg | Freq: Once | INTRAMUSCULAR | Status: AC
  Administered 2016-09-18: 20 mg via INTRAMUSCULAR
  Filled 2016-09-18: qty 2

## 2016-09-18 MED ORDER — ONDANSETRON 4 MG PO TBDP: 4.0000 mg | ORAL_TABLET | Freq: Three times a day (TID) | ORAL | 0 refills | Status: DC | PRN

## 2016-09-18 MED ORDER — IBUPROFEN 800 MG PO TABS
800.0000 mg | ORAL_TABLET | Freq: Once | ORAL | Status: AC
  Administered 2016-09-18: 800 mg via ORAL
  Filled 2016-09-18: qty 1

## 2016-09-18 MED ORDER — DIPHENHYDRAMINE HCL 50 MG/ML IJ SOLN
50.0000 mg | Freq: Once | INTRAMUSCULAR | Status: AC
  Administered 2016-09-18: 50 mg via INTRAMUSCULAR
  Filled 2016-09-18: qty 1

## 2016-09-18 MED ORDER — HYDROCODONE-ACETAMINOPHEN 5-325 MG PO TABS: 1.0000 | ORAL_TABLET | ORAL | 0 refills | Status: DC | PRN

## 2016-09-18 NOTE — ED Triage Notes (Signed)
States he was pulling up some bushes  Thinks he may have stung by something  Right hand swollen

## 2016-09-18 NOTE — ED Provider Notes (Signed)
[facility] Emergency Department Provider Note  ____________________________________________  Time seen: Approximately 10:37 AM  I have reviewed the triage vital signs and the nursing notes.   HISTORY  Chief Complaint Hand Pain    HPI Lance Reynolds is a 40 y.o. male who presents with right hand pain and swelling that has progressively worsened since yesterday. He was pulling up some bushes when he was stung by a black insect on his right hand. He immediately took 2 benadryl to try to stave off any reaction. The right hand continued to hurt and swell. Patient also tried calamine lotion and wrapping the hand and ice to help with swelling, with no improvement. Patient woke up this morning and noticed that swelling had extended into the wrist. Also experiencing itching and restriction in movement of fingers and wrist. Denies fever, throat swelling, facial swelling, chest tightness, SOB. Patient is HIV positive and takes daily antiretrovirals, reports low viral loads.    Past Medical History:  Diagnosis Date  . Anxiety   . HIV positive (HCC)   . Immune deficiency disorder [facility])     Patient Active Problem List   Diagnosis Date Noted  . Herpetic gingivostomatitis 09/21/2015  . HIV disease (HCC) 09/21/2015  . Cellulitis of scrotum 09/21/2015  . Cellulitis 09/18/2015    History reviewed. No pertinent surgical history.  Prior to Admission medications   Medication Sig Start Date End Date Taking? Authorizing Provider  albuterol (PROVENTIL HFA;VENTOLIN HFA) 108 (90 Base) MCG/ACT inhaler Inhale 2 puffs into the lungs every 6 (six) hours as needed for wheezing or shortness of breath. 05/16/16   Gayla Doss, MD  famotidine (PEPCID) 20 MG tablet Take 1 tablet (20 mg total) by mouth 2 (two) times daily. 09/18/16 09/18/17  Christiane Ha D Halaina Vanduzer, PA-C  HYDROcodone-acetaminophen (NORCO/VICODIN) 5-325 MG tablet Take 1 tablet by mouth every 4 (four) hours as needed for moderate  pain. 09/18/16   Delorise Royals Joany Khatib, PA-C  ondansetron (ZOFRAN-ODT) 4 MG disintegrating tablet Take 1 tablet (4 mg total) by mouth every 8 (eight) hours as needed for nausea or vomiting. 09/18/16   Delorise Royals Guss Farruggia, PA-C  predniSONE (DELTASONE) 50 MG tablet Take 1 tablet (50 mg total) by mouth daily with breakfast. 09/18/16   Delorise Royals Dowell Hoon, PA-C  tiZANidine (ZANAFLEX) 4 MG tablet Take 1 tablet (4 mg total) by mouth every 8 (eight) hours as needed for muscle spasms. 02/07/16 02/06/17  Chinita Pester, FNP  valACYclovir (VALTREX) 1000 MG tablet Take 1 tablet (1,000 mg total) by mouth 3 (three) times daily. 09/21/15   Enid Baas, MD    Allergies Tramadol and Percocet [oxycodone-acetaminophen]  No family history on file.  Social History Social History  Substance Use Topics  . Smoking status: Current Some Day Smoker    Years: 26.00    Types: Cigarettes  . Smokeless tobacco: Current User  . Alcohol use Yes     Review of Systems  Constitutional: No fever/chills Eyes: No edema, drainage or itching.  ENT: No upper respiratory complaints. Cardiovascular: no chest pain. Respiratory: no cough. No SOB. Musculoskeletal: Positive for pain and swelling in right hand and wrist. Skin: Negative for rash. Positive for erythema to right hand. ____________________________________________   PHYSICAL EXAM:  VITAL SIGNS: ED Triage Vitals  Enc Vitals Group     BP 09/18/16 1032 120/85     Pulse Rate 09/18/16 1032 79     Resp 09/18/16 1032 18     Temp 09/18/16 1032 98 F (  36.7 C)     Temp Source 09/18/16 1032 Oral     SpO2 09/18/16 1032 100 %     Weight 09/18/16 1033 210 lb (95.3 kg)     Height 09/18/16 1033 6\' 1"  (1.854 m)     Head Circumference --      Peak Flow --      Pain Score 09/18/16 1034 10     Pain Loc --      Pain Edu? --      Excl. in GC? --      Constitutional: Alert and oriented. Well appearing and in no acute distress. Eyes: Conjunctivae are normal.  Head:  Atraumatic. ENT:      Nose: No congestion/rhinnorhea.      Mouth/Throat: Mucous membranes are moist.  Neck: No stridor. Supple, full ROM without pain or difficulty.  Cardiovascular: Normal rate, regular rhythm. Normal S1 and S2.  Good peripheral circulation. Respiratory: Normal respiratory effort without tachypnea or retractions. Lungs CTAB. Good air entry to the bases with no decreased or absent breath sounds. Musculoskeletal: Swelling and erythema noted to right hand and wrist. ROM in wright hand and wrist restricted due to swelling. ROM in right elbow intact.  Neurologic:  Normal speech and language. No gross focal neurologic deficits are appreciated.  Skin:  Skin is warm and dry, small puncture wounds on right hand. No rash noted. Psychiatric: Mood is anxious. Speech and behavior are normal. Patient exhibits appropriate insight and judgement.   ____________________________________________   LABS (all labs ordered are listed, but only abnormal results are displayed)  Labs Reviewed - No data to display ____________________________________________  EKG  None ____________________________________________  RADIOLOGY   No results found.  ____________________________________________    PROCEDURES  Procedure(s) performed:    Procedures    Medications  diphenhydrAMINE (BENADRYL) injection 50 mg (50 mg Intramuscular Given 09/18/16 1114)  dexamethasone (DECADRON) injection 20 mg (20 mg Intramuscular Given 09/18/16 1114)  famotidine (PEPCID) tablet 20 mg (20 mg Oral Given 09/18/16 1114)  ibuprofen (ADVIL,MOTRIN) tablet 800 mg (800 mg Oral Given 09/18/16 1208)     ____________________________________________   INITIAL IMPRESSION / ASSESSMENT AND PLAN / ED COURSE  Pertinent labs & imaging results that were available during my care of the patient were reviewed by me and considered in my medical decision making (see chart for details).  Review of the [place] CSRS was performed in  accordance of the [facility] prior to dispensing any controlled drugs.  Clinical Course    Patient's diagnosis is consistent with allergic reaction due to an insect sting. Patient will be discharged home with prescriptions for prednisone, pepcid, zofran, norco. Patient is to follow up with PCP as needed or otherwise directed. Patient is given ED precautions to return to the ED for any worsening or new symptoms. No other emergency medicine complaints at this time.      ____________________________________________  FINAL CLINICAL IMPRESSION(S) / ED DIAGNOSES  Final diagnoses:  Allergic reaction to insect sting, accidental or unintentional, initial encounter  Swelling of right hand  Right hand pain      NEW MEDICATIONS STARTED DURING THIS VISIT:  New Prescriptions   FAMOTIDINE (PEPCID) 20 MG TABLET    Take 1 tablet (20 mg total) by mouth 2 (two) times daily.   HYDROCODONE-ACETAMINOPHEN (NORCO/VICODIN) 5-325 MG TABLET    Take 1 tablet by mouth every 4 (four) hours as needed for moderate pain.   ONDANSETRON (ZOFRAN-ODT) 4 MG DISINTEGRATING TABLET    Take 1 tablet (4 mg  total) by mouth every 8 (eight) hours as needed for nausea or vomiting.   PREDNISONE (DELTASONE) 50 MG TABLET    Take 1 tablet (50 mg total) by mouth daily with breakfast.        This chart was dictated using voice recognition software/Dragon. Despite best efforts to proofread, errors can occur which can change the meaning. Any change was purely unintentional.   Racheal Patches, PA-C 09/18/16 1316    Governor Rooks, MD 09/18/16 1359

## 2016-11-03 ENCOUNTER — Encounter: Payer: Self-pay | Admitting: Emergency Medicine

## 2016-11-03 ENCOUNTER — Emergency Department
Admission: EM | Admit: 2016-11-03 | Discharge: 2016-11-03 | Disposition: A | Discharge status: Home / Self Care | Payer: Self-pay | Attending: Emergency Medicine | Admitting: Emergency Medicine

## 2016-11-03 ENCOUNTER — Emergency Department: Payer: Self-pay

## 2016-11-03 DIAGNOSIS — K529 Noninfective gastroenteritis and colitis, unspecified: Secondary | ICD-10-CM

## 2016-11-03 DIAGNOSIS — F17210 Nicotine dependence, cigarettes, uncomplicated: Secondary | ICD-10-CM | POA: Insufficient documentation

## 2016-11-03 DIAGNOSIS — Z21 Asymptomatic human immunodeficiency virus [HIV] infection status: Secondary | ICD-10-CM | POA: Insufficient documentation

## 2016-11-03 LAB — URINALYSIS COMPLETE WITH MICROSCOPIC (ARMC ONLY)
BACTERIA UA: NONE SEEN
Bilirubin Urine: NEGATIVE
GLUCOSE, UA: NEGATIVE mg/dL
HGB URINE DIPSTICK: NEGATIVE
Ketones, ur: NEGATIVE mg/dL
LEUKOCYTES UA: NEGATIVE
Nitrite: NEGATIVE
PH: 5 (ref 5.0–8.0)
Protein, ur: 30 mg/dL — AB
RBC / HPF: NONE SEEN RBC/hpf (ref 0–5)
Specific Gravity, Urine: 1.029 (ref 1.005–1.030)

## 2016-11-03 LAB — COMPREHENSIVE METABOLIC PANEL
ALBUMIN: 4.3 g/dL (ref 3.5–5.0)
ALT: 15 U/L — ABNORMAL LOW (ref 17–63)
AST: 23 U/L (ref 15–41)
Alkaline Phosphatase: 62 U/L (ref 38–126)
Anion gap: 9 (ref 5–15)
BUN: 15 mg/dL (ref 6–20)
CHLORIDE: 102 mmol/L (ref 101–111)
CO2: 23 mmol/L (ref 22–32)
Calcium: 9.4 mg/dL (ref 8.9–10.3)
Creatinine, Ser: 1.33 mg/dL — ABNORMAL HIGH (ref 0.61–1.24)
GFR calc Af Amer: >60 mL/min (ref >60)
GLUCOSE: 136 mg/dL — AB (ref 65–99)
POTASSIUM: 3.8 mmol/L (ref 3.5–5.1)
SODIUM: 134 mmol/L — AB (ref 135–145)
Total Bilirubin: 1.5 mg/dL — ABNORMAL HIGH (ref 0.3–1.2)
Total Protein: 8.1 g/dL (ref 6.5–8.1)

## 2016-11-03 LAB — CBC
HEMATOCRIT: 46.1 % (ref 40.0–52.0)
Hemoglobin: 15.8 g/dL (ref 13.0–18.0)
MCH: 33.3 pg (ref 26.0–34.0)
MCHC: 34.3 g/dL (ref 32.0–36.0)
MCV: 97 fL (ref 80.0–100.0)
Platelets: 227 10*3/uL (ref 150–440)
RBC: 4.75 MIL/uL (ref 4.40–5.90)
RDW: 13.3 % (ref 11.5–14.5)
WBC: 13.9 10*3/uL — AB (ref 3.8–10.6)

## 2016-11-03 LAB — LIPASE, BLOOD: LIPASE: 25 U/L (ref 11–51)

## 2016-11-03 MED ORDER — HYDROCODONE-ACETAMINOPHEN 5-325 MG PO TABS
ORAL_TABLET | ORAL | Status: AC
  Administered 2016-11-03: 1 via ORAL
  Filled 2016-11-03: qty 1

## 2016-11-03 MED ORDER — HYDROCODONE-ACETAMINOPHEN 5-325 MG PO TABS
1.0000 | ORAL_TABLET | Freq: Once | ORAL | Status: AC
  Administered 2016-11-03: 1 via ORAL

## 2016-11-03 MED ORDER — ONDANSETRON HCL 4 MG/2ML IJ SOLN
INTRAMUSCULAR | Status: AC
  Filled 2016-11-03: qty 2

## 2016-11-03 MED ORDER — ONDANSETRON HCL 4 MG/2ML IJ SOLN
4.0000 mg | Freq: Once | INTRAMUSCULAR | Status: AC
  Administered 2016-11-03: 4 mg via INTRAVENOUS

## 2016-11-03 MED ORDER — IOPAMIDOL (ISOVUE-300) INJECTION 61%
30.0000 mL | Freq: Once | INTRAVENOUS | Status: AC | PRN
Start: 2016-11-03 — End: 2016-11-03
  Administered 2016-11-03: 30 mL via ORAL
  Filled 2016-11-03: qty 30

## 2016-11-03 MED ORDER — CIPROFLOXACIN HCL 500 MG PO TABS
500.0000 mg | ORAL_TABLET | Freq: Once | ORAL | Status: AC
  Administered 2016-11-03: 500 mg via ORAL

## 2016-11-03 MED ORDER — CIPROFLOXACIN HCL 500 MG PO TABS
ORAL_TABLET | ORAL | Status: AC
  Filled 2016-11-03: qty 1

## 2016-11-03 MED ORDER — IOPAMIDOL (ISOVUE-300) INJECTION 61%
100.0000 mL | Freq: Once | INTRAVENOUS | Status: AC | PRN
  Administered 2016-11-03: 100 mL via INTRAVENOUS
  Filled 2016-11-03: qty 100

## 2016-11-03 MED ORDER — METRONIDAZOLE 500 MG PO TABS: 500.0000 mg | ORAL_TABLET | Freq: Three times a day (TID) | ORAL | 0 refills | Status: DC

## 2016-11-03 MED ORDER — METRONIDAZOLE 500 MG PO TABS
500.0000 mg | ORAL_TABLET | Freq: Once | ORAL | Status: AC
  Administered 2016-11-03: 500 mg via ORAL

## 2016-11-03 MED ORDER — ONDANSETRON 4 MG PO TBDP: 4.0000 mg | ORAL_TABLET | Freq: Four times a day (QID) | ORAL | 0 refills | Status: DC | PRN

## 2016-11-03 MED ORDER — METRONIDAZOLE 500 MG PO TABS
ORAL_TABLET | ORAL | Status: AC
  Administered 2016-11-03: 500 mg via ORAL
  Filled 2016-11-03: qty 1

## 2016-11-03 MED ORDER — CIPROFLOXACIN HCL 500 MG PO TABS: 500.0000 mg | ORAL_TABLET | Freq: Two times a day (BID) | ORAL | 0 refills | Status: DC

## 2016-11-03 MED ORDER — MORPHINE SULFATE (PF) 4 MG/ML IV SOLN
INTRAVENOUS | Status: AC
  Filled 2016-11-03: qty 1

## 2016-11-03 MED ORDER — MORPHINE SULFATE (PF) 4 MG/ML IV SOLN
4.0000 mg | Freq: Once | INTRAVENOUS | Status: AC
  Administered 2016-11-03: 4 mg via INTRAVENOUS

## 2016-11-03 MED ORDER — SODIUM CHLORIDE 0.9 % IV BOLUS (SEPSIS)
1000.0000 mL | Freq: Once | INTRAVENOUS | Status: AC
  Administered 2016-11-03: 1000 mL via INTRAVENOUS

## 2016-11-03 NOTE — ED Notes (Signed)
Iv started  Pt drinking po contrast.   Family with pt

## 2016-11-03 NOTE — ED Notes (Signed)
Called and spoke with lab. They will start labs from triage.

## 2016-11-03 NOTE — ED Notes (Signed)
Pt given urinal.

## 2016-11-03 NOTE — ED Provider Notes (Signed)
[facility] Emergency Department Provider Note   ____________________________________________   First MD Initiated Contact with Patient 11/03/16 1720     (approximate)  I have reviewed the triage vital signs and the nursing notes.   HISTORY  Chief Complaint Abdominal Pain and Constipation   HPI Lance Reynolds is a 40 y.o. male history of HIV, currently on retroviral therapy  Patient reports that for the last day and a half he's had discomfort in the lower abdomen, he felt constipated last night, throughout the day he's felt increasing pain across the lower abdomen slightly more on the left. He also reports that he's felt some straining with urination, but denies feeling as though he is unable to void.  Some nausea no vomiting. No fever or chills. No chest pain or shortness of breath. He has a hernia and his belly button, but reports no change in that she's been present for quite some time.  No back pain.  Past Medical History:  Diagnosis Date  . Anxiety   . HIV positive (HCC)   . Immune deficiency disorder [facility]HCC)     Patient Active Problem List   Diagnosis Date Noted  . Herpetic gingivostomatitis 09/21/2015  . HIV disease (HCC) 09/21/2015  . Cellulitis of scrotum 09/21/2015  . Cellulitis 09/18/2015    History reviewed. No pertinent surgical history.  Prior to Admission medications   Medication Sig Start Date End Date Taking? Authorizing Provider  albuterol (PROVENTIL HFA;VENTOLIN HFA) 108 (90 Base) MCG/ACT inhaler Inhale 2 puffs into the lungs every 6 (six) hours as needed for wheezing or shortness of breath. 05/16/16   Gayla DossEryka A Gayle, MD  ciprofloxacin (CIPRO) 500 MG tablet Take 1 tablet (500 mg total) by mouth 2 (two) times daily. 11/03/16   Sharyn CreamerMark Joletta Manner, MD  famotidine (PEPCID) 20 MG tablet Take 1 tablet (20 mg total) by mouth 2 (two) times daily. 09/18/16 09/18/17  Christiane HaJonathan D Cuthriell, PA-C  metroNIDAZOLE (FLAGYL) 500 MG tablet Take 1 tablet  (500 mg total) by mouth 3 (three) times daily. 11/03/16   Sharyn CreamerMark Refael Fulop, MD  ondansetron (ZOFRAN ODT) 4 MG disintegrating tablet Take 1 tablet (4 mg total) by mouth every 6 (six) hours as needed for nausea or vomiting. 11/03/16   Sharyn CreamerMark Shonika Kolasinski, MD  valACYclovir (VALTREX) 1000 MG tablet Take 1 tablet (1,000 mg total) by mouth 3 (three) times daily. 09/21/15   Enid Baasadhika Kalisetti, MD    Allergies Tramadol and Percocet [oxycodone-acetaminophen]  No family history on file.  Social History Social History  Substance Use Topics  . Smoking status: Current Some Day Smoker    Years: 26.00    Types: Cigarettes  . Smokeless tobacco: Current User  . Alcohol use Yes    Review of Systems Constitutional: No fever/chills Eyes: No visual changes. ENT: No sore throat. Cardiovascular: Denies chest pain. Respiratory: Denies shortness of breath. Gastrointestinal: No vomiting.  No diarrhea.  Feels modestly constipated Genitourinary: Negative for dysuria. Musculoskeletal: Negative for back pain. Skin: Negative for rash. Neurological: Negative for headaches, focal weakness or numbness.  10-point ROS otherwise negative.  ____________________________________________   PHYSICAL EXAM:  VITAL SIGNS: ED Triage Vitals  Enc Vitals Group     BP 11/03/16 1616 102/62     Pulse Rate 11/03/16 1616 (!) 102     Resp 11/03/16 1616 20     Temp 11/03/16 1616 98.2 F (36.8 C)     Temp Source 11/03/16 1616 Oral     SpO2 11/03/16 1616 98 %  Weight 11/03/16 1616 200 lb (90.7 kg)     Height 11/03/16 1616 6\' 1"  (1.854 m)     Head Circumference --      Peak Flow --      Pain Score 11/03/16 1630 7     Pain Loc --      Pain Edu? --      Excl. in GC? --     Constitutional: Alert and oriented. Well appearing and in no acute distress. Eyes: Conjunctivae are normal. PERRL. EOMI. Head: Atraumatic. Nose: No congestion/rhinnorhea. Mouth/Throat: Mucous membranes are moist.  Oropharynx non-erythematous. Neck: No  stridor.   Cardiovascular: Normal rate, regular rhythm. Grossly normal heart sounds.  Good peripheral circulation. Respiratory: Normal respiratory effort.  No retractions. Lungs CTAB. Gastrointestinal: Soft and nontenderAcross the upper abdomen, mild discomfort in the lower right abdomen no rebound or guarding, patient does exhibit modest discomfort in the left lower quadrant. No distention. No abdominal bruits. No CVA tenderness. Patient has a umbilical hernia, minimal discomfort, but soft, nonerythematous to palpation and reducible. No groin masses. No testicular erythema or tenderness Musculoskeletal: No lower extremity tenderness nor edema.  No joint effusions. Neurologic:  Normal speech and language. No gross focal neurologic deficits are appreciated. Skin:  Skin is warm, dry and intact. No rash noted. Psychiatric: Mood and affect are normal. Speech and behavior are normal.  ____________________________________________   LABS (all labs ordered are listed, but only abnormal results are displayed)  Labs Reviewed  COMPREHENSIVE METABOLIC PANEL - Abnormal; Notable for the following:       Result Value   Sodium 134 (*)    Glucose, Bld 136 (*)    Creatinine, Ser 1.33 (*)    ALT 15 (*)    Total Bilirubin 1.5 (*)    All other components within normal limits  CBC - Abnormal; Notable for the following:    WBC 13.9 (*)    All other components within normal limits  URINALYSIS COMPLETEWITH MICROSCOPIC ([facility] ONLY) - Abnormal; Notable for the following:    Color, Urine AMBER (*)    APPearance CLEAR (*)    Protein, ur 30 (*)    Squamous Epithelial / LPF 0-5 (*)    All other components within normal limits  LIPASE, BLOOD   ____________________________________________  EKG   ____________________________________________  RADIOLOGY    Ct Abdomen Pelvis W Contrast  Result Date: 11/03/2016 CLINICAL DATA:  Generalized abdominal pain and constipation difficulty urinating EXAM: CT  ABDOMEN AND PELVIS WITH CONTRAST TECHNIQUE: Multidetector CT imaging of the abdomen and pelvis was performed using the standard protocol following bolus administration of intravenous contrast. CONTRAST:  100mL ISOVUE-300 IOPAMIDOL (ISOVUE-300) INJECTION 61% COMPARISON:  02/07/2016 FINDINGS: Lower chest: Lung bases show no acute infiltrate or effusion. Hepatobiliary: No focal liver abnormality is seen. No gallstones, gallbladder wall thickening, or biliary dilatation. Pancreas: Unremarkable. No pancreatic ductal dilatation or surrounding inflammatory changes. Spleen: Normal in size without focal abnormality. Adrenals/Urinary Tract: Adrenal glands are within normal limits. The kidneys show no hydronephrosis or focal abnormalities. Diffuse wall thickening of the bladder. Stomach/Bowel: There is no dilated small bowel to suggest an obstruction. There is colon wall thickening and mild surrounding inflammatory changes involving the distal transverse colon, splenic flexure, descending colon, and sigmoid colon. There are no significant diverticula present. No perforation is seen. No abscess. Appendix is visualized and is normal. Vascular/Lymphatic: Non aneurysmal aorta. There is mild retroperitoneal adenopathy, there are small lymph nodes within the mesenteries. No significantly enlarged pelvic lymph nodes. Reproductive: Prostate  is unremarkable. Other: No free air or free fluid. There are fat filled inguinal hernias bilaterally. There is a small fatty containing umbilical hernia. Musculoskeletal: No acute or significant osseous findings. IMPRESSION: 1. Wall thickening and associated inflammatory change involving the distal transverse colon, splenic flexure, descending colon and sigmoid colon. Findings could relate to a colitis of infectious or inflammatory etiology. Eventual colonoscopy follow-up is suggested to exclude other cause of colon wall thickening. 2. Mild retroperitoneal and mesenteric adenopathy, possibly  reactive. 3. Thick-walled appearance of the urinary bladder, correlate clinically to exclude cystitis. Electronically Signed   By: Jasmine Pang M.D.   On: 11/03/2016 19:26    ____________________________________________   PROCEDURES  Procedure(s) performed: None  Procedures  Critical Care performed: No  ____________________________________________   INITIAL IMPRESSION / ASSESSMENT AND PLAN / ED COURSE  Pertinent labs & imaging results that were available during my care of the patient were reviewed by me and considered in my medical decision making (see chart for details).  Differential diagnosis includes but is not limited to, abdominal perforation, aortic dissection, cholecystitis, appendicitis, diverticulitis, colitis, esophagitis/gastritis, kidney stone, pyelonephritis, urinary tract infection, aortic aneurysm. All are considered in decision and treatment plan. Based upon the patient's presentation and risk factors, multiple plan to proceed with CT abdomen and pelvis, pain control and further workup here.  CT reveals evidence of likely colitis, possibly infectious or inflammatory. Given the undifferentiated status plan to trial antibiotics, patient able to take by mouth well, pain controlled with stable hemodynamics. Provided discount saving scarred, discussed careful return precautions and follow-up and treatment recommendations with the patient and he is in agreement. Patient was able to void about 250s ML's of clear urine, post void residual about 0  Patient reports pain much improved at the time of discharge, ambulating with no distress. Abdomen soft, minimal tenderness left side. Careful return precautions advised. Patient is not driving himself home. Reports that he can take hydrocodone without difficulty, one tablet given here and he self reports having a prescription area on file for hydrocodone from previous ED visit which he will plan to fill   Clinical Course       ____________________________________________   FINAL CLINICAL IMPRESSION(S) / ED DIAGNOSES  Final diagnoses:  Colitis, acute      NEW MEDICATIONS STARTED DURING THIS VISIT:  Discharge Medication List as of 11/03/2016  9:37 PM    START taking these medications   Details  ciprofloxacin (CIPRO) 500 MG tablet Take 1 tablet (500 mg total) by mouth 2 (two) times daily., Starting Tue 11/03/2016, Print    metroNIDAZOLE (FLAGYL) 500 MG tablet Take 1 tablet (500 mg total) by mouth 3 (three) times daily., Starting Tue 11/03/2016, Print         Note:  This document was prepared using Dragon voice recognition software and may include unintentional dictation errors.     Sharyn Creamer, MD 11/03/16 2215

## 2016-11-03 NOTE — ED Notes (Signed)
Patient transported to CT 

## 2016-11-03 NOTE — Discharge Instructions (Signed)
We believe your symptoms are caused by colitis (inflammation of the colon).  Most of the time this condition (please read through the included information) can be cured with outpatient antibiotics, but needs close outpatient follow-up with your primary doctor and gastroenterology.  Please take the full course of prescribed medication(s) and follow up with the doctors recommended above.  Return to the ED if your abdominal pain worsens or fails to improve, you develop bloody vomiting, bloody diarrhea, you are unable to tolerate fluids due to vomiting, fever greater than 101, or other symptoms that concern you.  No driving tonight or while taking hydrocodone.

## 2016-11-03 NOTE — ED Triage Notes (Signed)
Pt with abd pain and constipation started last night with trouble urinating.

## 2016-11-03 NOTE — ED Notes (Addendum)
Waiting on MD exam. NAD

## 2016-11-03 NOTE — ED Notes (Signed)
Did a bladder scan after pt voided, found no urine retained in bladder.

## 2016-12-16 ENCOUNTER — Emergency Department: Payer: Self-pay

## 2016-12-16 ENCOUNTER — Encounter: Payer: Self-pay | Admitting: Emergency Medicine

## 2016-12-16 ENCOUNTER — Emergency Department
Admission: EM | Admit: 2016-12-16 | Discharge: 2016-12-16 | Disposition: A | Discharge status: Home / Self Care | Payer: Self-pay | Attending: Emergency Medicine | Admitting: Emergency Medicine

## 2016-12-16 DIAGNOSIS — F17210 Nicotine dependence, cigarettes, uncomplicated: Secondary | ICD-10-CM | POA: Insufficient documentation

## 2016-12-16 DIAGNOSIS — Z21 Asymptomatic human immunodeficiency virus [HIV] infection status: Secondary | ICD-10-CM | POA: Insufficient documentation

## 2016-12-16 DIAGNOSIS — R1084 Generalized abdominal pain: Secondary | ICD-10-CM

## 2016-12-16 DIAGNOSIS — Z79899 Other long term (current) drug therapy: Secondary | ICD-10-CM | POA: Insufficient documentation

## 2016-12-16 DIAGNOSIS — A080 Rotaviral enteritis: Secondary | ICD-10-CM | POA: Insufficient documentation

## 2016-12-16 LAB — COMPREHENSIVE METABOLIC PANEL
ALK PHOS: 68 U/L (ref 38–126)
ALT: 19 U/L (ref 17–63)
AST: 22 U/L (ref 15–41)
Albumin: 4.3 g/dL (ref 3.5–5.0)
Anion gap: 8 (ref 5–15)
BILIRUBIN TOTAL: 0.9 mg/dL (ref 0.3–1.2)
BUN: 13 mg/dL (ref 6–20)
CO2: 23 mmol/L (ref 22–32)
Calcium: 9.4 mg/dL (ref 8.9–10.3)
Chloride: 107 mmol/L (ref 101–111)
Creatinine, Ser: 1.07 mg/dL (ref 0.61–1.24)
GFR calc Af Amer: >60 mL/min (ref >60)
GLUCOSE: 101 mg/dL — AB (ref 65–99)
POTASSIUM: 3.4 mmol/L — AB (ref 3.5–5.1)
Sodium: 138 mmol/L (ref 135–145)
Total Protein: 8.2 g/dL — ABNORMAL HIGH (ref 6.5–8.1)

## 2016-12-16 LAB — URINALYSIS, COMPLETE (UACMP) WITH MICROSCOPIC
BILIRUBIN URINE: NEGATIVE
Bacteria, UA: NONE SEEN
Glucose, UA: NEGATIVE mg/dL
Hgb urine dipstick: NEGATIVE
Ketones, ur: NEGATIVE mg/dL
Leukocytes, UA: NEGATIVE
Nitrite: NEGATIVE
PH: 5 (ref 5.0–8.0)
Protein, ur: NEGATIVE mg/dL
SPECIFIC GRAVITY, URINE: 1.021 (ref 1.005–1.030)
SQUAMOUS EPITHELIAL / LPF: NONE SEEN

## 2016-12-16 LAB — CBC WITH DIFFERENTIAL/PLATELET
Basophils Absolute: 0.1 10*3/uL (ref 0–0.1)
Basophils Relative: 1 %
Eosinophils Absolute: 0.1 10*3/uL (ref 0–0.7)
Eosinophils Relative: 2 %
HEMATOCRIT: 44.7 % (ref 40.0–52.0)
HEMOGLOBIN: 15.5 g/dL (ref 13.0–18.0)
LYMPHS PCT: 29 %
Lymphs Abs: 1.7 10*3/uL (ref 1.0–3.6)
MCH: 32.5 pg (ref 26.0–34.0)
MCHC: 34.6 g/dL (ref 32.0–36.0)
MCV: 93.9 fL (ref 80.0–100.0)
MONOS PCT: 12 %
Monocytes Absolute: 0.7 10*3/uL (ref 0.2–1.0)
NEUTROS PCT: 56 %
Neutro Abs: 3.4 10*3/uL (ref 1.4–6.5)
Platelets: 244 10*3/uL (ref 150–440)
RBC: 4.76 MIL/uL (ref 4.40–5.90)
RDW: 13.1 % (ref 11.5–14.5)
WBC: 5.9 10*3/uL (ref 3.8–10.6)

## 2016-12-16 LAB — GASTROINTESTINAL PANEL BY PCR, STOOL (REPLACES STOOL CULTURE)
ADENOVIRUS F40/41: NOT DETECTED
ASTROVIRUS: NOT DETECTED
CRYPTOSPORIDIUM: NOT DETECTED
CYCLOSPORA CAYETANENSIS: NOT DETECTED
Campylobacter species: NOT DETECTED
ENTAMOEBA HISTOLYTICA: NOT DETECTED
ENTEROPATHOGENIC E COLI (EPEC): NOT DETECTED
ENTEROTOXIGENIC E COLI (ETEC): NOT DETECTED
Enteroaggregative E coli (EAEC): NOT DETECTED
Giardia lamblia: NOT DETECTED
Norovirus GI/GII: NOT DETECTED
PLESIMONAS SHIGELLOIDES: NOT DETECTED
Rotavirus A: DETECTED — AB
Salmonella species: NOT DETECTED
Sapovirus (I, II, IV, and V): NOT DETECTED
Shiga like toxin producing E coli (STEC): NOT DETECTED
Shigella/Enteroinvasive E coli (EIEC): NOT DETECTED
VIBRIO CHOLERAE: NOT DETECTED
VIBRIO SPECIES: NOT DETECTED
YERSINIA ENTEROCOLITICA: NOT DETECTED

## 2016-12-16 LAB — LIPASE, BLOOD: Lipase: 22 U/L (ref 11–51)

## 2016-12-16 MED ORDER — SODIUM CHLORIDE 0.9 % IV BOLUS (SEPSIS)
1000.0000 mL | Freq: Once | INTRAVENOUS | Status: AC
  Administered 2016-12-16: 1000 mL via INTRAVENOUS

## 2016-12-16 MED ORDER — LOPERAMIDE HCL 2 MG PO TABS: 4.0000 mg | ORAL_TABLET | Freq: Four times a day (QID) | ORAL | 0 refills | Status: DC | PRN

## 2016-12-16 MED ORDER — FAMOTIDINE 20 MG PO TABS: 20.0000 mg | ORAL_TABLET | Freq: Two times a day (BID) | ORAL | 0 refills | Status: DC

## 2016-12-16 MED ORDER — ONDANSETRON HCL 4 MG/2ML IJ SOLN
4.0000 mg | Freq: Once | INTRAMUSCULAR | Status: AC | PRN
  Administered 2016-12-16: 4 mg via INTRAVENOUS
  Filled 2016-12-16: qty 2

## 2016-12-16 MED ORDER — MORPHINE SULFATE (PF) 2 MG/ML IV SOLN
2.0000 mg | Freq: Once | INTRAVENOUS | Status: AC
  Administered 2016-12-16: 2 mg via INTRAVENOUS
  Filled 2016-12-16: qty 1

## 2016-12-16 MED ORDER — IOPAMIDOL (ISOVUE-300) INJECTION 61%
30.0000 mL | Freq: Once | INTRAVENOUS | Status: AC | PRN
  Administered 2016-12-16: 30 mL via ORAL

## 2016-12-16 MED ORDER — ONDANSETRON 4 MG PO TBDP: 4.0000 mg | ORAL_TABLET | Freq: Three times a day (TID) | ORAL | 0 refills | Status: DC | PRN

## 2016-12-16 MED ORDER — IOPAMIDOL (ISOVUE-300) INJECTION 61%
100.0000 mL | Freq: Once | INTRAVENOUS | Status: AC | PRN
  Administered 2016-12-16: 100 mL via INTRAVENOUS

## 2016-12-16 NOTE — ED Notes (Signed)
Patient transported to CT 

## 2016-12-16 NOTE — ED Provider Notes (Signed)
[facility] Emergency Department Provider Note   First MD Initiated Contact with Patient 12/16/16 [phone]     (approximate)  I have reviewed the triage vital signs and the nursing notes.   HISTORY  Chief Complaint Abdominal Pain    HPI Lance Reynolds is a 41 y.o. male with history of HIV (patient unsure of his last CD4 count) currently taking antiretrovirals presents to the emergency department with left lower quadrant abdominal pain that is currently 9 out of 10 a accompanied with nausea vomiting and diarrhea. She states she's only had 1 episode of vomiting however had had multiple episode of nonbloody diarrhea   Past Medical History:  Diagnosis Date  . Anxiety   . HIV positive (HCC)   . Immune deficiency disorder [facility])     Patient Active Problem List   Diagnosis Date Noted  . Herpetic gingivostomatitis 09/21/2015  . HIV disease (HCC) 09/21/2015  . Cellulitis of scrotum 09/21/2015  . Cellulitis 09/18/2015    History reviewed. No pertinent surgical history.  Prior to Admission medications   Medication Sig Start Date End Date Taking? Authorizing Provider  albuterol (PROVENTIL HFA;VENTOLIN HFA) 108 (90 Base) MCG/ACT inhaler Inhale 2 puffs into the lungs every 6 (six) hours as needed for wheezing or shortness of breath. 05/16/16   Gayla Doss, MD  ciprofloxacin (CIPRO) 500 MG tablet Take 1 tablet (500 mg total) by mouth 2 (two) times daily. 11/03/16   Sharyn Creamer, MD  famotidine (PEPCID) 20 MG tablet Take 1 tablet (20 mg total) by mouth 2 (two) times daily. 09/18/16 09/18/17  Christiane Ha D Cuthriell, PA-C  metroNIDAZOLE (FLAGYL) 500 MG tablet Take 1 tablet (500 mg total) by mouth 3 (three) times daily. 11/03/16   Sharyn Creamer, MD  ondansetron (ZOFRAN ODT) 4 MG disintegrating tablet Take 1 tablet (4 mg total) by mouth every 6 (six) hours as needed for nausea or vomiting. 11/03/16   Sharyn Creamer, MD  valACYclovir (VALTREX) 1000 MG tablet Take 1 tablet (1,000 mg  total) by mouth 3 (three) times daily. 09/21/15   Enid Baas, MD    Allergies Tramadol and Percocet [oxycodone-acetaminophen]  History reviewed. No pertinent family history.  Social History Social History  Substance Use Topics  . Smoking status: Current Some Day Smoker    Years: 26.00    Types: Cigarettes  . Smokeless tobacco: Current User  . Alcohol use Yes    Review of Systems Constitutional: No fever/chills Eyes: No visual changes. ENT: No sore throat. Cardiovascular: Denies chest pain. Respiratory: Denies shortness of breath. Gastrointestinal: Positive for abdominal pain and vomiting and diarrhea  Genitourinary: Negative for dysuria. Musculoskeletal: Negative for back pain. Skin: Negative for rash. Neurological: Negative for headaches, focal weakness or numbness.  10-point ROS otherwise negative.  ____________________________________________   PHYSICAL EXAM:  VITAL SIGNS: ED Triage Vitals  Enc Vitals Group     BP 12/16/16 0603 127/77     Pulse Rate 12/16/16 0603 96     Resp 12/16/16 0603 15     Temp 12/16/16 0603 97.8 F (36.6 C)     Temp Source 12/16/16 0603 Oral     SpO2 12/16/16 0603 100 %     Weight 12/16/16 0604 190 lb (86.2 kg)     Height 12/16/16 0604 6\' 1"  (1.854 m)     Head Circumference --      Peak Flow --      Pain Score --      Pain Loc --  Pain Edu? --      Excl. in GC? --     Constitutional: Alert and oriented. Well appearing and in no acute distress. Eyes: Conjunctivae are normal. PERRL. EOMI. Head: Atraumatic. Mouth/Throat: Mucous membranes are moist.  Oropharynx non-erythematous. Neck: No stridor.  Cardiovascular: Normal rate, regular rhythm. Good peripheral circulation. Grossly normal heart sounds. Respiratory: Normal respiratory effort.  No retractions. Lungs CTAB. Gastrointestinal: Left lower quadrant tenderness to palpation No distention.  Musculoskeletal: No lower extremity tenderness nor edema. No gross deformities  of extremities. Neurologic:  Normal speech and language. No gross focal neurologic deficits are appreciated.  Skin:  Skin is warm, dry and intact. No rash noted. Psychiatric: Mood and affect are normal. Speech and behavior are normal.  ____________________________________________   LABS (all labs ordered are listed, but only abnormal results are displayed)  Labs Reviewed  GASTROINTESTINAL PANEL BY PCR, STOOL (REPLACES STOOL CULTURE)  CBC WITH DIFFERENTIAL/PLATELET  LIPASE, BLOOD  COMPREHENSIVE METABOLIC PANEL  CBC  URINALYSIS, COMPLETE (UACMP) WITH MICROSCOPIC     RADIOLOGY I, Weldon N BROWN, personally viewed and evaluated these images (plain radiographs) as part of my medical decision making, as well as reviewing the written report by the radiologist.  No results found.   Procedures   INITIAL IMPRESSION / ASSESSMENT AND PLAN / ED COURSE  Pertinent labs & imaging results that were available during my care of the patient were reviewed by me and considered in my medical decision making (see chart for details).  41 year old male presenting to the emergency department with three-day history of nausea vomiting diarrhea and left lower quadrant pain. We'll obtain stool studies laboratory data and CT scan of the abdomen and pelvis. Patient's care transferred to Dr. Scotty CourtStafford.   Clinical Course     ____________________________________________  FINAL CLINICAL IMPRESSION(S) / ED DIAGNOSES  Final diagnoses:  Enteritis due to Rotavirus  Generalized abdominal pain     MEDICATIONS GIVEN DURING THIS VISIT:  Medications  sodium chloride 0.9 % bolus 1,000 mL (1,000 mLs Intravenous New Bag/Given 12/16/16 0654)  ondansetron (ZOFRAN) injection 4 mg (4 mg Intravenous Given 12/16/16 [id number])  morphine 2 MG/ML injection 2 mg (2 mg Intravenous Given 12/16/16 0654)     NEW OUTPATIENT MEDICATIONS STARTED DURING THIS VISIT:  New Prescriptions   No medications on file    Modified  Medications   No medications on file    Discontinued Medications   No medications on file     Note:  This document was prepared using Dragon voice recognition software and may include unintentional dictation errors.    Darci Currentandolph N Brown, MD 12/17/16 Moses [email or web address]

## 2016-12-16 NOTE — ED Provider Notes (Signed)
Patient is calm and comfortable with normal vital signs, stable. CT unremarkable, consistent with enteritis. GI panel diagnostic of rotavirus infection. Treat symptomatically, focus on oral hydration, follow up with primary care.   Sharman CheekPhillip Lawonda Pretlow, MD 12/16/16 1349

## 2016-12-16 NOTE — ED Notes (Signed)
Hooked pt back up to monitor 

## 2016-12-16 NOTE — ED Triage Notes (Signed)
Pt ambulatory to triage with slow gait, no distress noted. Pt c/o lower quadrant abdominal pain, N/V/D x 3 days.

## 2017-06-24 DIAGNOSIS — R21 Rash and other nonspecific skin eruption: Secondary | ICD-10-CM | POA: Insufficient documentation

## 2017-06-24 DIAGNOSIS — Z5321 Procedure and treatment not carried out due to patient leaving prior to being seen by health care provider: Secondary | ICD-10-CM | POA: Insufficient documentation

## 2017-06-24 NOTE — ED Triage Notes (Signed)
Pt in with co blisters to left forehead and around outside of left eye for 2 days. States unsure of cause, states areas are painful.

## 2017-06-25 ENCOUNTER — Emergency Department
Admission: EM | Admit: 2017-06-25 | Discharge: 2017-06-25 | Disposition: A | Discharge status: Home / Self Care | Payer: Self-pay | Attending: Emergency Medicine | Admitting: Emergency Medicine

## 2017-06-25 ENCOUNTER — Telehealth: Payer: Self-pay | Admitting: Emergency Medicine

## 2017-06-25 NOTE — Telephone Encounter (Addendum)
Called patient due to lwot to inquire about condition and follow up plans. The person on voicemail message is not the patient so I did not leave message.  Called [facility]  Asked them to try to contact patient and ask him to be seen there or here to make sure his eye is okay.

## 2017-06-26 DIAGNOSIS — B029 Zoster without complications: Secondary | ICD-10-CM | POA: Insufficient documentation

## 2017-06-27 DIAGNOSIS — K219 Gastro-esophageal reflux disease without esophagitis: Secondary | ICD-10-CM | POA: Insufficient documentation

## 2017-09-22 ENCOUNTER — Encounter: Payer: Self-pay | Admitting: Emergency Medicine

## 2017-09-22 ENCOUNTER — Emergency Department: Payer: Self-pay

## 2017-09-22 ENCOUNTER — Emergency Department
Admission: EM | Admit: 2017-09-22 | Discharge: 2017-09-23 | Disposition: A | Discharge status: Home / Self Care | Payer: Self-pay | Attending: Emergency Medicine | Admitting: Emergency Medicine

## 2017-09-22 DIAGNOSIS — F17210 Nicotine dependence, cigarettes, uncomplicated: Secondary | ICD-10-CM | POA: Insufficient documentation

## 2017-09-22 DIAGNOSIS — Z79899 Other long term (current) drug therapy: Secondary | ICD-10-CM | POA: Insufficient documentation

## 2017-09-22 DIAGNOSIS — M5412 Radiculopathy, cervical region: Secondary | ICD-10-CM | POA: Insufficient documentation

## 2017-09-22 DIAGNOSIS — Z21 Asymptomatic human immunodeficiency virus [HIV] infection status: Secondary | ICD-10-CM | POA: Insufficient documentation

## 2017-09-22 MED ORDER — METHYLPREDNISOLONE SODIUM SUCC 125 MG IJ SOLR
125.0000 mg | Freq: Once | INTRAMUSCULAR | Status: AC
  Administered 2017-09-22: 125 mg via INTRAMUSCULAR
  Filled 2017-09-22: qty 2

## 2017-09-22 MED ORDER — FENTANYL CITRATE (PF) 100 MCG/2ML IJ SOLN
50.0000 ug | Freq: Once | INTRAMUSCULAR | Status: DC
Start: 2017-09-22 — End: 2017-09-22

## 2017-09-22 MED ORDER — CYCLOBENZAPRINE HCL 10 MG PO TABS: 10.0000 mg | ORAL_TABLET | Freq: Three times a day (TID) | ORAL | 0 refills | Status: AC | PRN

## 2017-09-22 MED ORDER — PREDNISONE 50 MG PO TABS: ORAL_TABLET | ORAL | 0 refills | Status: DC

## 2017-09-22 MED ORDER — ORPHENADRINE CITRATE 30 MG/ML IJ SOLN
60.0000 mg | Freq: Two times a day (BID) | INTRAMUSCULAR | Status: DC
  Administered 2017-09-22: 60 mg via INTRAMUSCULAR
  Filled 2017-09-22: qty 2

## 2017-09-22 NOTE — ED Triage Notes (Signed)
Patient states that he has pain in his left shoulder radiating down to his hand times one month. Patient states that the pain become worse yesterday. Patient denies any injury to his shoulder but states that he has worked at [employer] for 7 months and has injured his left elbow 3 times since starting work there. Patient states that he has not seen anyone for his shoulder pain or his elbow injuries.

## 2017-09-22 NOTE — ED Notes (Signed)
Reviewed d/c instructions, follow-up care, prescription with patient. Pt verbalized understanding.  

## 2017-09-22 NOTE — ED Notes (Signed)
Pt reports that he is having left shoulder pain that radiates down his arm into his hand Pt also c/o lower bad pain that radiates into right side of back

## 2017-09-22 NOTE — ED Provider Notes (Signed)
[facility] Emergency Department Provider Note  ____________________________________________  Time seen: Approximately 10:18 PM  I have reviewed the triage vital signs and the nursing notes.   HISTORY  Chief Complaint Shoulder Pain    HPI Lance Reynolds is a 40 y.o. male presents to the emergency department with 10 out of 10 left upper extremity pain. Patient reports that pain starts from left shoulder and radiates into his left fingertips. Patient reports that his pain is worsened with range of motion at the neck. Patient denies weakness or changes in sensation of the upper extremity. 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 He denies falls or mechanisms trauma. No alleviating measures have been attempted.    Past Medical History:  Diagnosis Date  . Anxiety   . HIV positive (HCC)   . Immune deficiency disorder [facility])     Patient Active Problem List   Diagnosis Date Noted  . Herpetic gingivostomatitis 09/21/2015  . HIV disease (HCC) 09/21/2015  . Cellulitis of scrotum 09/21/2015  . Cellulitis 09/18/2015    History reviewed. No pertinent surgical history.  Prior to Admission medications   Medication Sig Start Date End Date Taking? Authorizing Provider  albuterol (PROVENTIL HFA;VENTOLIN HFA) 108 (90 Base) MCG/ACT inhaler Inhale 2 puffs into the lungs every 6 (six) hours as needed for wheezing or shortness of breath. Patient not taking: Reported on 12/16/2016 05/16/16   Gayla Doss, MD  cyclobenzaprine (FLEXERIL) 10 MG tablet Take 1 tablet (10 mg total) by mouth 3 (three) times daily as needed. 09/22/17 09/27/17  Orvil Feil, PA-C  elvitegravir-cobicistat-emtricitabine-tenofovir (GENVOYA) 150-150-200-10 MG TABS tablet Take 1 tablet by mouth daily with breakfast.    [provider]  famotidine (PEPCID) 20 MG tablet Take 1 tablet (20 mg total) by mouth 2 (two) times daily. 12/16/16   Sharman Cheek, MD  loperamide (IMODIUM A-D) 2 MG  tablet Take 2 tablets (4 mg total) by mouth 4 (four) times daily as needed for diarrhea or loose stools. 12/16/16   Sharman Cheek, MD  metroNIDAZOLE (FLAGYL) 500 MG tablet Take 1 tablet (500 mg total) by mouth 3 (three) times daily. 11/03/16   Sharyn Creamer, MD  omeprazole (PRILOSEC) 40 MG capsule Take 40 mg by mouth daily.    [provider]  ondansetron (ZOFRAN ODT) 4 MG disintegrating tablet Take 1 tablet (4 mg total) by mouth every 8 (eight) hours as needed for nausea or vomiting. 12/16/16   Sharman Cheek, MD  predniSONE (DELTASONE) 50 MG tablet Take one tablet by mouth for the next five days. 09/22/17   Orvil Feil, PA-C    Allergies Tramadol and Percocet [oxycodone-acetaminophen]  No family history on file.  Social History Social History  Substance Use Topics  . Smoking status: Current Some Day Smoker    Years: 26.00    Types: Cigarettes  . Smokeless tobacco: Current User  . Alcohol use Yes     Review of Systems  Constitutional: No fever/chills Eyes: No visual changes. No discharge ENT: No upper respiratory complaints. Cardiovascular: no chest pain. Respiratory: no cough. No SOB. Musculoskeletal: Patient has left upper extremity pain.  Skin: Negative for rash, abrasions, lacerations, ecchymosis. Neurological: Negative for headaches, focal weakness or numbness.   ____________________________________________   PHYSICAL EXAM:  VITAL SIGNS: ED Triage Vitals  Enc Vitals Group     BP 09/22/17 2043 129/77     Pulse Rate 09/22/17 2043 99     Resp 09/22/17 2043 18  Temp 09/22/17 2043 98.3 F (36.8 C)     Temp Source 09/22/17 2043 Oral     SpO2 09/22/17 2043 97 %     Weight 09/22/17 2044 210 lb (95.3 kg)     Height 09/22/17 2044  (1.854 m)     Head Circumference --      Peak Flow --      Pain Score 09/22/17 2042 5     Pain Loc --      Pain Edu? --      Excl. in GC? --      Constitutional: Alert and oriented. Well appearing and in no acute  distress. Eyes: Conjunctivae are normal. PERRL. EOMI. Head: Atraumatic. Cardiovascular: Normal rate, regular rhythm. Normal S1 and S2.  Good peripheral circulation. Respiratory: Normal respiratory effort without tachypnea or retractions. Lungs CTAB. Good air entry to the bases with no decreased or absent breath sounds. Musculoskeletal: Patient is able to perform full range of motion at the neck. Patient's pain is reproduced with range of motion at the neck. Patient exhibits 5 out of 5 strength in the upper extremities. He can perform full range of motion at the left shoulder, left elbow and left wrist. Palpable radial pulse, left. Neurologic:  Normal speech and language. No gross focal neurologic deficits are appreciated.  Skin:  Skin is warm, dry and intact. No rash noted. Psychiatric: Mood and affect are normal. Speech and behavior are normal. Patient exhibits appropriate insight and judgement.   ____________________________________________   LABS (all labs ordered are listed, but only abnormal results are displayed)  Labs Reviewed - No data to display ____________________________________________  EKG   ____________________________________________  RADIOLOGY Geraldo Pitter, personally viewed and evaluated these images (plain radiographs) as part of my medical decision making, as well as reviewing the written report by the radiologist.    Dg Cervical Spine 2-3 Views  Result Date: 09/22/2017 CLINICAL DATA:  LEFT shoulder pain radiating down to hand for 1 month EXAM: CERVICAL SPINE - 2-3 VIEW COMPARISON:  None FINDINGS: Prevertebral soft tissues normal thickness. Osseous mineralization normal. Vertebral body and disc space heights maintained. No acute fracture, subluxation, or bone destruction. C1-C2 alignment normal. Tips of lung apices clear. IMPRESSION: No acute osseous abnormalities. Electronically Signed   By: Ulyses Southward M.D.   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 Joint spaces preserved. No fracture, dislocation, or bone destruction. No joint effusion. IMPRESSION: Normal exam. Electronically Signed   By: Ulyses Southward M.D.   On: 09/22/2017 22:38    ____________________________________________    PROCEDURES  Procedure(s) performed:    Procedures    Medications  orphenadrine (NORFLEX) injection 60 mg (60 mg Intramuscular Given 09/22/17 2307)  methylPREDNISolone sodium succinate (SOLU-MEDROL) 125 mg/2 mL injection 125 mg (125 mg Intramuscular Given 09/22/17 2305)     ____________________________________________   INITIAL IMPRESSION / ASSESSMENT AND PLAN / ED COURSE  Pertinent labs & imaging results that were available during my care of the patient were reviewed by me and considered in my medical decision making (see chart for details).  Review of the [place] CSRS was performed in accordance of the [facility] prior to dispensing any controlled drugs.  Assessment and Plan: Left Upper Extremity Pain: Patient presents to the emergency department with left upper extremity radiculopathy reproduced with range of motion at the neck. Patient was given an injection of Solu-Medrol in the emergency department as well as Norflex. Patient was discharged with prednisone. Patient was advised to follow up with primary care as needed. All patient questions were answered.   ____________________________________________  FINAL CLINICAL IMPRESSION(S) / ED DIAGNOSES  Final diagnoses:  Cervical radiculopathy      NEW MEDICATIONS STARTED DURING THIS VISIT:  New Prescriptions   CYCLOBENZAPRINE (FLEXERIL) 10 MG TABLET    Take 1 tablet (10 mg total) by mouth 3 (three) times daily as  needed.   PREDNISONE (DELTASONE) 50 MG TABLET    Take one tablet by mouth for the next five days.        This chart was dictated using voice recognition software/Dragon. Despite best efforts to proofread, errors can occur which can change the meaning. Any change was purely unintentional.    Orvil Feil, PA-C 09/22/17 2345    Myrna Blazer, MD 09/22/17 [phone]

## 2018-03-30 ENCOUNTER — Emergency Department: Payer: 59

## 2018-03-30 ENCOUNTER — Emergency Department
Admission: EM | Admit: 2018-03-30 | Discharge: 2018-03-30 | Disposition: A | Discharge status: Home / Self Care | Payer: 59 | Attending: Emergency Medicine | Admitting: Emergency Medicine

## 2018-03-30 ENCOUNTER — Other Ambulatory Visit: Payer: Self-pay

## 2018-03-30 DIAGNOSIS — M25551 Pain in right hip: Secondary | ICD-10-CM | POA: Insufficient documentation

## 2018-03-30 DIAGNOSIS — B20 Human immunodeficiency virus [HIV] disease: Secondary | ICD-10-CM | POA: Diagnosis not present

## 2018-03-30 DIAGNOSIS — F17210 Nicotine dependence, cigarettes, uncomplicated: Secondary | ICD-10-CM | POA: Insufficient documentation

## 2018-03-30 DIAGNOSIS — Z79899 Other long term (current) drug therapy: Secondary | ICD-10-CM | POA: Insufficient documentation

## 2018-03-30 MED ORDER — CYCLOBENZAPRINE HCL 10 MG PO TABS: 10.0000 mg | ORAL_TABLET | Freq: Three times a day (TID) | ORAL | 0 refills | Status: DC | PRN

## 2018-03-30 NOTE — ED Provider Notes (Signed)
[facility] Emergency Department Provider Note ____________________________________________  Time seen: Approximately 8:09 AM  I have reviewed the triage vital signs and the nursing notes.   HISTORY  Chief Complaint Hip Pain    HPI TAYJON HALLADAY is a 42 y.o. male who presents to the emergency department for evaluation and treatment of right hip pain. No specific injury, but he works in a [employer] and may have twisted or lifted wrong. No alleviating measures have been attempted for this complaint.   Past Medical History:  Diagnosis Date  . Anxiety   . HIV positive (HCC)   . Immune deficiency disorder [facility])     Patient Active Problem List   Diagnosis Date Noted  . Herpetic gingivostomatitis 09/21/2015  . HIV disease (HCC) 09/21/2015  . Cellulitis of scrotum 09/21/2015  . Cellulitis 09/18/2015    No past surgical history on file.  Prior to Admission medications   Medication Sig Start Date End Date Taking? Authorizing Provider  albuterol (PROVENTIL HFA;VENTOLIN HFA) 108 (90 Base) MCG/ACT inhaler Inhale 2 puffs into the lungs every 6 (six) hours as needed for wheezing or shortness of breath. Patient not taking: Reported on 12/16/2016 05/16/16   Gayla Doss, MD  cyclobenzaprine (FLEXERIL) 10 MG tablet Take 1 tablet (10 mg total) by mouth 3 (three) times daily as needed for muscle spasms. 03/30/18   Donterrius Santucci, Rulon Eisenmenger B, FNP  elvitegravir-cobicistat-emtricitabine-tenofovir (GENVOYA) 150-150-200-10 MG TABS tablet Take 1 tablet by mouth daily with breakfast.    [provider]  famotidine (PEPCID) 20 MG tablet Take 1 tablet (20 mg total) by mouth 2 (two) times daily. 12/16/16   Sharman Cheek, MD  loperamide (IMODIUM A-D) 2 MG tablet Take 2 tablets (4 mg total) by mouth 4 (four) times daily as needed for diarrhea or loose stools. 12/16/16   Sharman Cheek, MD  metroNIDAZOLE (FLAGYL) 500 MG tablet Take 1 tablet (500 mg total) by mouth 3 (three) times  daily. 11/03/16   Sharyn Creamer, MD  omeprazole (PRILOSEC) 40 MG capsule Take 40 mg by mouth daily.    [provider]  ondansetron (ZOFRAN ODT) 4 MG disintegrating tablet Take 1 tablet (4 mg total) by mouth every 8 (eight) hours as needed for nausea or vomiting. 12/16/16   Sharman Cheek, MD  predniSONE (DELTASONE) 50 MG tablet Take one tablet by mouth for the next five days. 09/22/17   Orvil Feil, PA-C    Allergies Tramadol and Percocet [oxycodone-acetaminophen]  No family history on file.  Social History Social History   Tobacco Use  . Smoking status: Current Some Day Smoker    Years: 26.00    Types: Cigarettes  . Smokeless tobacco: Current User  Substance Use Topics  . Alcohol use: Yes  . Drug use: No    Review of Systems Constitutional: Negative for fever. Cardiovascular: Negative for chest pain. Respiratory: Negative for shortness of breath. Musculoskeletal: Positive for right hip pain Skin: Negative for rash lesion or wound  Neurological: Negative for decrease in sensation  ____________________________________________   PHYSICAL EXAM:  VITAL SIGNS: ED Triage Vitals  Enc Vitals Group     BP 03/30/18 0754 125/87     Pulse Rate 03/30/18 0754 80     Resp 03/30/18 0754 16     Temp 03/30/18 0754 98.1 F (36.7 C)     Temp Source 03/30/18 0754 Oral     SpO2 03/30/18 0754 96 %     Weight 03/30/18 0755 200 lb (90.7 kg)  Height 03/30/18 0755 6\' 1"  (1.854 m)     Head Circumference --      Peak Flow --      Pain Score 03/30/18 0755 4     Pain Loc --      Pain Edu? --      Excl. in GC? --     Constitutional: Alert and oriented. Well appearing and in no acute distress. Eyes: Conjunctivae are clear without discharge or drainage Head: Atraumatic Neck: Supple Respiratory: No cough. Respirations are even and unlabored. Musculoskeletal: Right hip tender to palpation. No increase in pain with internal or external rotation. Neurologic: Awake, alert, and  oriented x 4.   Skin: Intact  Psychiatric: Affect and behavior are appropriate.  ____________________________________________   LABS (all labs ordered are listed, but only abnormal results are displayed)  Labs Reviewed - No data to display ____________________________________________  RADIOLOGY  Image of the right hip is negative for acute bony abnormality per radiology. ____________________________________________   PROCEDURES  Procedures  ____________________________________________   INITIAL IMPRESSION / ASSESSMENT AND PLAN / ED COURSE  Tandy GawJeffrey A Agan is a 42 y.o. who presents to the emergency department for treatment and evaluation of nontraumatic right hip pain. X-ray and exam are consistent with muscle strain. He will be treated with flexeril and encouraged to rest and use ice on the hip for the next couple of days.   Patient instructed to follow-up with Primary Care or orthopedics if not improving over the week.  He was also instructed to return to the emergency department for symptoms that change or worsen if unable schedule an appointment with orthopedics or primary care.  Medications - No data to display  Pertinent labs & imaging results that were available during my care of the patient were reviewed by me and considered in my medical decision making (see chart for details).  _________________________________________   FINAL CLINICAL IMPRESSION(S) / ED DIAGNOSES  Final diagnoses:  Right hip pain    ED Discharge Orders        Ordered    cyclobenzaprine (FLEXERIL) 10 MG tablet  3 times daily PRN     03/30/18 0925       If controlled substance prescribed during this visit, 12 month history viewed on the [place] prior to issuing an initial prescription for Schedule II or III opiod.    Chinita Pesterriplett, Gwendoline Judy B, FNP 03/30/18 1049    Darci CurrentBrown, [place] N, MD 03/30/18 1124

## 2018-03-30 NOTE — ED Triage Notes (Addendum)
Patient complaining of right hip pain beginning this AM, describes as "like a knife going through my side".  Pain increases with ambulation.  Denies known injury.  States pain is a 4/10 while sitting, worse with standing.

## 2018-03-30 NOTE — ED Notes (Signed)
See triage note  Presents with right hip pain this am  States he had an injury a couple of years ago  But this am he stood up and developed severe pain to right hip which made him fall

## 2018-06-27 ENCOUNTER — Other Ambulatory Visit: Payer: Self-pay

## 2018-06-27 ENCOUNTER — Emergency Department: Payer: 59

## 2018-06-27 ENCOUNTER — Encounter: Payer: Self-pay | Admitting: Emergency Medicine

## 2018-06-27 ENCOUNTER — Emergency Department
Admission: EM | Admit: 2018-06-27 | Discharge: 2018-06-27 | Disposition: A | Discharge status: Home / Self Care | Payer: 59 | Attending: Student in an Organized Health Care Education/Training Program | Admitting: Student in an Organized Health Care Education/Training Program

## 2018-06-27 DIAGNOSIS — Z79899 Other long term (current) drug therapy: Secondary | ICD-10-CM | POA: Diagnosis not present

## 2018-06-27 DIAGNOSIS — R222 Localized swelling, mass and lump, trunk: Secondary | ICD-10-CM | POA: Diagnosis present

## 2018-06-27 DIAGNOSIS — F17210 Nicotine dependence, cigarettes, uncomplicated: Secondary | ICD-10-CM | POA: Diagnosis not present

## 2018-06-27 DIAGNOSIS — L02219 Cutaneous abscess of trunk, unspecified: Secondary | ICD-10-CM | POA: Diagnosis not present

## 2018-06-27 DIAGNOSIS — L0291 Cutaneous abscess, unspecified: Secondary | ICD-10-CM

## 2018-06-27 HISTORY — DX: Major depressive disorder, single episode, unspecified: F32.9

## 2018-06-27 HISTORY — DX: Gastro-esophageal reflux disease without esophagitis: K21.9

## 2018-06-27 HISTORY — DX: Depression, unspecified: F32.A

## 2018-06-27 MED ORDER — CLINDAMYCIN HCL 300 MG PO CAPS: 300.0000 mg | ORAL_CAPSULE | Freq: Three times a day (TID) | ORAL | 0 refills | Status: AC

## 2018-06-27 MED ORDER — LIDOCAINE HCL (PF) 1 % IJ SOLN
5.0000 mL | Freq: Once | INTRAMUSCULAR | Status: AC
  Administered 2018-06-27: 5 mL via INTRADERMAL
  Filled 2018-06-27: qty 5

## 2018-06-27 NOTE — ED Provider Notes (Signed)
[facility] Emergency Department Provider Note  ____________________________________________  Time seen: Approximately 10:00 AM  I have reviewed the triage vital signs and the nursing notes.   HISTORY  Chief Complaint Abscess    HPI Lance Reynolds is a 42 y.o. male that presents to the emergency department for evaluation of mass to chest. This one has started to get red.  He has had these multiple before and never been evaluated. Patient regularly sees Dr. Genevie AnnWilde for HIV and is on medication.No fever, chills, nausea, vomiting.    Past Medical History:  Diagnosis Date  . Anxiety   . Depression   . GERD (gastroesophageal reflux disease)   . HIV positive (HCC)   . Immune deficiency disorder [facility](HCC)     Patient Active Problem List   Diagnosis Date Noted  . Herpetic gingivostomatitis 09/21/2015  . HIV disease (HCC) 09/21/2015  . Cellulitis of scrotum 09/21/2015  . Cellulitis 09/18/2015    History reviewed. No pertinent surgical history.  Prior to Admission medications   Medication Sig Start Date End Date Taking? Authorizing Provider  albuterol (PROVENTIL HFA;VENTOLIN HFA) 108 (90 Base) MCG/ACT inhaler Inhale 2 puffs into the lungs every 6 (six) hours as needed for wheezing or shortness of breath. Patient not taking: Reported on 12/16/2016 05/16/16   Gayla DossGayle, Eryka A, MD  clindamycin (CLEOCIN) 300 MG capsule Take 1 capsule (300 mg total) by mouth 3 (three) times daily for 10 days. 06/27/18 07/07/18  Enid DerryWagner, Alexandre Faries, PA-C  cyclobenzaprine (FLEXERIL) 10 MG tablet Take 1 tablet (10 mg total) by mouth 3 (three) times daily as needed for muscle spasms. 03/30/18   Triplett, Rulon Eisenmengerari B, FNP  elvitegravir-cobicistat-emtricitabine-tenofovir (GENVOYA) 150-150-200-10 MG TABS tablet Take 1 tablet by mouth daily with breakfast.    [provider]  famotidine (PEPCID) 20 MG tablet Take 1 tablet (20 mg total) by mouth 2 (two) times daily. 12/16/16   Sharman CheekStafford, Phillip, MD   loperamide (IMODIUM A-D) 2 MG tablet Take 2 tablets (4 mg total) by mouth 4 (four) times daily as needed for diarrhea or loose stools. 12/16/16   Sharman CheekStafford, Phillip, MD  metroNIDAZOLE (FLAGYL) 500 MG tablet Take 1 tablet (500 mg total) by mouth 3 (three) times daily. 11/03/16   Sharyn CreamerQuale, Mark, MD  omeprazole (PRILOSEC) 40 MG capsule Take 40 mg by mouth daily.    [provider]  ondansetron (ZOFRAN ODT) 4 MG disintegrating tablet Take 1 tablet (4 mg total) by mouth every 8 (eight) hours as needed for nausea or vomiting. 12/16/16   Sharman CheekStafford, Phillip, MD  predniSONE (DELTASONE) 50 MG tablet Take one tablet by mouth for the next five days. 09/22/17   Orvil FeilWoods, Jaclyn M, PA-C    Allergies Tramadol and Percocet [oxycodone-acetaminophen]  No family history on file.  Social History Social History   Tobacco Use  . Smoking status: Current Some Day Smoker    Years: 26.00    Types: Cigarettes  . Smokeless tobacco: Current User  Substance Use Topics  . Alcohol use: Yes  . Drug use: No     Review of Systems  Constitutional: No fever/chills Respiratory:  No SOB. Gastrointestinal: No nausea, no vomiting.  Musculoskeletal: Negative for musculoskeletal pain. Skin: Negative for abrasions, lacerations, ecchymosis. Positive for rash.   ____________________________________________   PHYSICAL EXAM:  VITAL SIGNS: ED Triage Vitals  Enc Vitals Group     BP 06/27/18 0849 118/80     Pulse Rate 06/27/18 0849 74     Resp 06/27/18 0849 16  Temp 06/27/18 0849 (!) 97.5 F (36.4 C)     Temp Source 06/27/18 0849 Oral     SpO2 06/27/18 0849 100 %     Weight 06/27/18 0850 200 lb (90.7 kg)     Height 06/27/18 0917 6\' 1"  (1.854 m)     Head Circumference --      Peak Flow --      Pain Score 06/27/18 0849 8     Pain Loc --      Pain Edu? --      Excl. in GC? --      Constitutional: Alert and oriented. Well appearing and in no acute distress. Eyes: Conjunctivae are normal. PERRL. EOMI. Head:  Atraumatic. ENT:      Ears:      Nose: No congestion/rhinnorhea.      Mouth/Throat: Mucous membranes are moist.  Neck: No stridor. Cardiovascular: Normal rate, regular rhythm.  Good peripheral circulation. Respiratory: Normal respiratory effort without tachypnea or retractions. Lungs CTAB. Good air entry to the bases with no decreased or absent breath sounds. Musculoskeletal: Full range of motion to all extremities. No gross deformities appreciated. Neurologic:  Normal speech and language. No gross focal neurologic deficits are appreciated.  Skin:  Skin is warm, dry. 1 cm by 1 cm area of induration to left chest wall. Tender to palpation. No drainage.  Psychiatric: Mood and affect are normal. Speech and behavior are normal. Patient exhibits appropriate insight and judgement.   ____________________________________________   LABS (all labs ordered are listed, but only abnormal results are displayed)  Labs Reviewed - No data to display ____________________________________________  EKG   ____________________________________________  RADIOLOGY Lexine Baton, personally viewed and evaluated these images (plain radiographs) as part of my medical decision making, as well as reviewing the written report by the radiologist.  [place] Chest Soft Tissue  Result Date: 06/27/2018 CLINICAL DATA:  Painful red bump on the chest for 3 days. Concern for abscess. EXAM: CHEST ULTRASOUND COMPARISON:  None. FINDINGS: Targeted ultrasound of the chest slightly left of midline in the area of clinical concern demonstrates an irregular hypoechoic collection measuring approximately 8 mm in greatest dimension just below the skin surface with diffuse surrounding skin/subcutaneous soft tissue thickening. IMPRESSION: 8 mm superficial fluid collection in the left chest wall concerning for abscess. Electronically Signed   By: Sebastian Ache M.D.   On: 06/27/2018 11:57     ____________________________________________    PROCEDURES  Procedure(s) performed:    Procedures  INCISION AND DRAINAGE Performed by: Enid Derry Consent: Verbal consent obtained. Risks and benefits: risks, benefits and alternatives were discussed Type: abscess  Body area: chest  Anesthesia: local infiltration  Incision was made with a scalpel.  Local anesthetic: lidocaine 1 % without epinephrine  Anesthetic total: 2 ml  Complexity: complex  Drainage: purulent  Drainage amount: 1 cc  Patient tolerance: Patient tolerated the procedure well with no immediate complications.    Medications  lidocaine (PF) (XYLOCAINE) 1 % injection 5 mL (5 mLs Intradermal Given by Other 06/27/18 1257)     ____________________________________________   INITIAL IMPRESSION / ASSESSMENT AND PLAN / ED COURSE  Pertinent labs & imaging results that were available during my care of the patient were reviewed by me and considered in my medical decision making (see chart for details).  Review of the [place] CSRS was performed in accordance of the [facility] prior to dispensing any controlled drugs.   Patient's diagnosis is consistent with infected epidermoid cyst. Vital signs and exam are  reassuring. Ultrasound consistent with abscess.  Abscess was successfully drained in ED.  Patient will be discharged home with prescriptions for clindamycin. Patient is to follow up with PCP  as directed. Patient is given ED precautions to return to the ED for any worsening or new symptoms.     ____________________________________________  FINAL CLINICAL IMPRESSION(S) / ED DIAGNOSES  Final diagnoses:  Abscess      NEW MEDICATIONS STARTED DURING THIS VISIT:  ED Discharge Orders        Ordered    clindamycin (CLEOCIN) 300 MG capsule  3 times daily     06/27/18 1248          This chart was dictated using voice recognition software/Dragon. Despite best efforts to proofread, errors can occur  which can change the meaning. Any change was purely unintentional.    Enid Derry, PA-C 06/27/18 1548    Willy Eddy, MD 06/27/18 [phone]

## 2018-06-27 NOTE — ED Notes (Signed)
See triage note  Presents with a possible abscess area to upper chest  Unsure if he was bitten by something  Small red area noted

## 2018-06-27 NOTE — ED Triage Notes (Signed)
Pt to ed with c/o redness to chest approx 2 inches in diameter x 3 days, pt states "I think I might have a cyst on my chest"

## 2018-08-24 ENCOUNTER — Other Ambulatory Visit: Payer: Self-pay

## 2018-08-24 ENCOUNTER — Encounter: Payer: Self-pay | Admitting: Emergency Medicine

## 2018-08-24 ENCOUNTER — Emergency Department
Admission: EM | Admit: 2018-08-24 | Discharge: 2018-08-24 | Disposition: A | Discharge status: Home / Self Care | Payer: 59 | Attending: Emergency Medicine | Admitting: Emergency Medicine

## 2018-08-24 DIAGNOSIS — Z79899 Other long term (current) drug therapy: Secondary | ICD-10-CM | POA: Insufficient documentation

## 2018-08-24 DIAGNOSIS — F17210 Nicotine dependence, cigarettes, uncomplicated: Secondary | ICD-10-CM | POA: Diagnosis not present

## 2018-08-24 DIAGNOSIS — R222 Localized swelling, mass and lump, trunk: Secondary | ICD-10-CM | POA: Diagnosis present

## 2018-08-24 DIAGNOSIS — Z21 Asymptomatic human immunodeficiency virus [HIV] infection status: Secondary | ICD-10-CM | POA: Diagnosis not present

## 2018-08-24 DIAGNOSIS — L723 Sebaceous cyst: Secondary | ICD-10-CM | POA: Diagnosis not present

## 2018-08-24 MED ORDER — CEPHALEXIN 500 MG PO CAPS: 500.0000 mg | ORAL_CAPSULE | Freq: Three times a day (TID) | ORAL | 0 refills | Status: DC

## 2018-08-24 NOTE — ED Provider Notes (Signed)
[facility] Emergency Department Provider Note  ____________________________________________   First MD Initiated Contact with Patient 08/24/18 1648     (approximate)  I have reviewed the triage vital signs and the nursing notes.   HISTORY  Chief Complaint Abscess    HPI Lance Reynolds is a 42 y.o. male presents emergency department.  He states he is having pain in his mid back from where his daughter tried to pop a abscess yesterday.  He states she was massaging his back and noticed a bump and started digging at it.  She states the stuff that came out of it smelled really bad.  States the area is very sore.  He has not had any difficulty with the site prior to her messing with it.  He denies any fever or chills.    Past Medical History:  Diagnosis Date  . Anxiety   . Depression   . GERD (gastroesophageal reflux disease)   . HIV positive (HCC)   . Immune deficiency disorder [facility])     Patient Active Problem List   Diagnosis Date Noted  . Herpetic gingivostomatitis 09/21/2015  . HIV disease (HCC) 09/21/2015  . Cellulitis of scrotum 09/21/2015  . Cellulitis 09/18/2015    History reviewed. No pertinent surgical history.  Prior to Admission medications   Medication Sig Start Date End Date Taking? Authorizing Provider  cephALEXin (KEFLEX) 500 MG capsule Take 1 capsule (500 mg total) by mouth 3 (three) times daily. 08/24/18   Alma Muegge, Roselyn Bering, PA-C  elvitegravir-cobicistat-emtricitabine-tenofovir (GENVOYA) 150-150-200-10 MG TABS tablet Take 1 tablet by mouth daily with breakfast.    [provider]  famotidine (PEPCID) 20 MG tablet Take 1 tablet (20 mg total) by mouth 2 (two) times daily. 12/16/16   Sharman Cheek, MD  omeprazole (PRILOSEC) 40 MG capsule Take 40 mg by mouth daily.    [provider]    Allergies Tramadol and Percocet [oxycodone-acetaminophen]  No family history on file.  Social History Social History    Tobacco Use  . Smoking status: Current Some Day Smoker    Years: 26.00    Types: Cigarettes  . Smokeless tobacco: Current User  Substance Use Topics  . Alcohol use: Yes  . Drug use: No    Review of Systems  Constitutional: No fever/chills Eyes: No visual changes. ENT: No sore throat. Respiratory: Denies cough Genitourinary: Negative for dysuria. Musculoskeletal: Negative for back pain. Skin: Negative for rash.  Positive for abscess    ____________________________________________   PHYSICAL EXAM:  VITAL SIGNS: ED Triage Vitals  Enc Vitals Group     BP 08/24/18 1623 129/83     Pulse Rate 08/24/18 1623 91     Resp 08/24/18 1623 16     Temp 08/24/18 1623 97.9 F (36.6 C)     Temp Source 08/24/18 1623 Oral     SpO2 08/24/18 1623 98 %     Weight 08/24/18 1625 200 lb (90.7 kg)     Height 08/24/18 1625 6' (1.829 m)     Head Circumference --      Peak Flow --      Pain Score 08/24/18 1625 0     Pain Loc --      Pain Edu? --      Excl. in GC? --     Constitutional: Alert and oriented. Well appearing and in no acute distress. Eyes: Conjunctivae are normal.  Head: Atraumatic. Nose: No congestion/rhinnorhea. Mouth/Throat: Mucous membranes are moist.   Neck:  supple no lymphadenopathy noted Cardiovascular: Normal rate, regular rhythm. Heart sounds are normal Respiratory: Normal respiratory effort.  No retractions, lungs c t a  GU: deferred Musculoskeletal: FROM all extremities, warm and well perfused Neurologic:  Normal speech and language.  Skin:  Skin is warm, dry and intact. No rash noted.  Positive for slight swelling noted but no obvious redness or drainage at a hard area typical of a sebaceous cyst. Psychiatric: Mood and affect are normal. Speech and behavior are normal.  ____________________________________________   LABS (all labs ordered are listed, but only abnormal results are displayed)  Labs Reviewed - No data to  display ____________________________________________   ____________________________________________  RADIOLOGY    ____________________________________________   PROCEDURES  Procedure(s) performed: No  Procedures    ____________________________________________   INITIAL IMPRESSION / ASSESSMENT AND PLAN / ED COURSE  Pertinent labs & imaging results that were available during my care of the patient were reviewed by me and considered in my medical decision making (see chart for details).   Patient is a 42 year old male presents emergency department complaining of an abscess on his lower back.  On physical exam the abscess is very small and tender.  There is no pus or drainage noted.  Explained the findings to the patient.  He was given prescription for Keflex.  Was given a work note for today.  He is to apply warm compress to the area.  Take Tylenol or ibuprofen as needed for pain.  Return emergency department if worsening.  States he understands and was discharged in stable condition.     As part of my medical decision making, I reviewed the following data within the electronic MEDICAL RECORD NUMBER Nursing notes reviewed and incorporated, Notes from prior ED visits and [place] Controlled Substance Database  ____________________________________________   FINAL CLINICAL IMPRESSION(S) / ED DIAGNOSES  Final diagnoses:  Sebaceous cyst      NEW MEDICATIONS STARTED DURING THIS VISIT:  New Prescriptions   CEPHALEXIN (KEFLEX) 500 MG CAPSULE    Take 1 capsule (500 mg total) by mouth 3 (three) times daily.     Note:  This document was prepared using Dragon voice recognition software and may include unintentional dictation errors.    Faythe Ghee, PA-C 08/24/18 1702    Sharman Cheek, MD 08/28/18 [phone]

## 2018-08-24 NOTE — ED Triage Notes (Signed)
Pt c/o abscess to lower back since yesterday, small red mark noted, no drainage or warmth noted by this RN. VSS

## 2018-08-24 NOTE — ED Notes (Signed)
See triage note  States his daughter found a small abscess area to lower back yesterday  No redness or swelling noted

## 2018-08-24 NOTE — Discharge Instructions (Addendum)
Follow-up with your regular doctor if not better in 3 to 5 days.  Return emergency department worsening.  Apply warm compress to the area.  Take Tylenol or ibuprofen as needed for pain.

## 2018-10-26 ENCOUNTER — Other Ambulatory Visit: Payer: Self-pay

## 2018-10-26 ENCOUNTER — Emergency Department
Admission: EM | Admit: 2018-10-26 | Discharge: 2018-10-26 | Disposition: A | Discharge status: Home / Self Care | Payer: 59 | Attending: Emergency Medicine | Admitting: Emergency Medicine

## 2018-10-26 ENCOUNTER — Encounter: Payer: Self-pay | Admitting: Emergency Medicine

## 2018-10-26 DIAGNOSIS — F17210 Nicotine dependence, cigarettes, uncomplicated: Secondary | ICD-10-CM | POA: Diagnosis not present

## 2018-10-26 DIAGNOSIS — R0981 Nasal congestion: Secondary | ICD-10-CM

## 2018-10-26 DIAGNOSIS — Z79899 Other long term (current) drug therapy: Secondary | ICD-10-CM | POA: Insufficient documentation

## 2018-10-26 DIAGNOSIS — B20 Human immunodeficiency virus [HIV] disease: Secondary | ICD-10-CM | POA: Insufficient documentation

## 2018-10-26 DIAGNOSIS — J069 Acute upper respiratory infection, unspecified: Secondary | ICD-10-CM | POA: Diagnosis not present

## 2018-10-26 MED ORDER — DEXTROMETHORPHAN-GUAIFENESIN 5-100 MG/5ML PO LIQD: 5.0000 mL | Freq: Three times a day (TID) | ORAL | 0 refills | Status: DC | PRN

## 2018-10-26 MED ORDER — AZITHROMYCIN 250 MG PO TABS: 250.0000 mg | ORAL_TABLET | Freq: Every day | ORAL | 0 refills | Status: DC

## 2018-10-26 MED ORDER — OXYMETAZOLINE HCL 0.05 % NA SOLN
1.0000 | Freq: Once | NASAL | Status: AC
  Administered 2018-10-26: 1 via NASAL
  Filled 2018-10-26: qty 15

## 2018-10-26 MED ORDER — GUAIFENESIN-DM 100-10 MG/5ML PO SYRP
5.0000 mL | ORAL_SOLUTION | Freq: Once | ORAL | Status: AC
  Administered 2018-10-26: 5 mL via ORAL
  Filled 2018-10-26: qty 5

## 2018-10-26 MED ORDER — AZITHROMYCIN 500 MG PO TABS
500.0000 mg | ORAL_TABLET | Freq: Once | ORAL | Status: AC
  Administered 2018-10-26: 500 mg via ORAL
  Filled 2018-10-26: qty 1

## 2018-10-26 NOTE — ED Triage Notes (Signed)
Pt presents to ED with nasal congestion and cough since tonight.

## 2018-10-26 NOTE — Discharge Instructions (Signed)
1.  Finish antibiotic as prescribed (Azithromycin 250 mg daily x4 days). 2.  You may take cough medicine as needed. 3.  Return to the ER for worsening symptoms, persistent vomiting, difficulty breathing or other concerns.

## 2018-10-26 NOTE — ED Provider Notes (Signed)
[facility] Emergency Department Provider Note   ____________________________________________   First MD Initiated Contact with Patient 10/26/18 [phone]     (approximate)  I have reviewed the triage vital signs and the nursing notes.   HISTORY  Chief Complaint Nasal Congestion and Cough    HPI Lance Reynolds is a 42 y.o. male who presents to the ED from home with a chief complaint of nasal congestion, sore throat and dry cough.  Symptoms x1 to 2 days.  Patient has a history of HIV on antiviral medications.  Denies associated fever, chills, chest pain, shortness of breath, abdominal pain, nausea or vomiting.  Denies recent travel or trauma.   Past Medical History:  Diagnosis Date  . Anxiety   . Depression   . GERD (gastroesophageal reflux disease)   . HIV positive (HCC)   . Immune deficiency disorder [facility](HCC)     Patient Active Problem List   Diagnosis Date Noted  . Herpetic gingivostomatitis 09/21/2015  . HIV disease (HCC) 09/21/2015  . Cellulitis of scrotum 09/21/2015  . Cellulitis 09/18/2015    History reviewed. No pertinent surgical history.  Prior to Admission medications   Medication Sig Start Date End Date Taking? Authorizing Provider  azithromycin (ZITHROMAX) 250 MG tablet Take 1 tablet (250 mg total) by mouth daily. 10/26/18   Irean HongSung, Mikaili Flippin J, MD  cephALEXin (KEFLEX) 500 MG capsule Take 1 capsule (500 mg total) by mouth 3 (three) times daily. 08/24/18   Fisher, Roselyn BeringSusan W, PA-C  Dextromethorphan-guaiFENesin 5-100 MG/5ML LIQD Take 5 mLs by mouth 3 (three) times daily as needed. 10/26/18   Irean HongSung, Jamian Andujo J, MD  elvitegravir-cobicistat-emtricitabine-tenofovir (GENVOYA) 150-150-200-10 MG TABS tablet Take 1 tablet by mouth daily with breakfast.    [provider]  famotidine (PEPCID) 20 MG tablet Take 1 tablet (20 mg total) by mouth 2 (two) times daily. 12/16/16   Sharman CheekStafford, Phillip, MD  omeprazole (PRILOSEC) 40 MG capsule Take 40 mg by mouth daily.     [provider]    Allergies Hydrocodone; Tramadol; and Percocet [oxycodone-acetaminophen]  No family history on file.  Social History Social History   Tobacco Use  . Smoking status: Current Some Day Smoker    Years: 26.00    Types: Cigarettes  . Smokeless tobacco: Current User  Substance Use Topics  . Alcohol use: Yes  . Drug use: No    Review of Systems  Constitutional: No fever/chills Eyes: No visual changes. ENT: Positive for nasal congestion and sore throat. Cardiovascular: Denies chest pain. Respiratory: Positive for dry cough.  Denies shortness of breath. Gastrointestinal: No abdominal pain.  No nausea, no vomiting.  No diarrhea.  No constipation. Genitourinary: Negative for dysuria. Musculoskeletal: Negative for back pain. Skin: Negative for rash. Neurological: Negative for headaches, focal weakness or numbness.   ____________________________________________   PHYSICAL EXAM:  VITAL SIGNS: ED Triage Vitals  Enc Vitals Group     BP 10/26/18 0445 (!) 154/96     Pulse Rate 10/26/18 0445 84     Resp 10/26/18 0445 (!) 22     Temp 10/26/18 0445 98.3 F (36.8 C)     Temp Source 10/26/18 0445 Oral     SpO2 10/26/18 0445 98 %     Weight 10/26/18 0439 205 lb (93 kg)     Height 10/26/18 0439 6\' 1"  (1.854 m)     Head Circumference --      Peak Flow --      Pain Score 10/26/18 0438 10  Pain Loc --      Pain Edu? --      Excl. in GC? --     Constitutional: Alert and oriented. Well appearing and in mild acute distress. Eyes: Conjunctivae are normal. PERRL. EOMI. Head: Atraumatic. Nose: Congestion/rhinnorhea. Mouth/Throat: Mucous membranes are moist.  Oropharynx mildly erythematous without tonsillar swelling, exudates or peritonsillar abscess.  There is no hoarse or muffled voice.  There is no drooling.  There is no thrush. Neck: No stridor.   Hematological: No adenopathy. Cardiovascular: Normal rate, regular rhythm. Grossly normal heart sounds.   Good peripheral circulation. Respiratory: Normal respiratory effort.  No retractions. Lungs CTAB. Gastrointestinal: Soft and nontender. No distention. No abdominal bruits. No CVA tenderness. Musculoskeletal: No lower extremity tenderness nor edema.  No joint effusions. Neurologic:  Normal speech and language. No gross focal neurologic deficits are appreciated. No gait instability. Skin:  Skin is warm, dry and intact. No rash noted.  No petechiae. Psychiatric: Mood and affect are normal. Speech and behavior are normal.  ____________________________________________   LABS (all labs ordered are listed, but only abnormal results are displayed)  Labs Reviewed - No data to display ____________________________________________  EKG  None ____________________________________________  RADIOLOGY  ED MD interpretation: None  Official radiology report(s): No results found.  ____________________________________________   PROCEDURES  Procedure(s) performed: None  Procedures  Critical Care performed: No  ____________________________________________   INITIAL IMPRESSION / ASSESSMENT AND PLAN / ED COURSE  As part of my medical decision making, I reviewed the following data within the electronic MEDICAL RECORD NUMBER History obtained from family, Nursing notes reviewed and incorporated, Old chart reviewed and Notes from prior ED visits   42 year old male who presents with nasal congestion and URI.  Will administer Afrin spray in each nare now.  Start Z-Pak, cough medicine and patient will follow-up closely with his PCP.  Strict return precautions given.  Patient and spouse verbalize understanding and agree with plan of care.      ____________________________________________   FINAL CLINICAL IMPRESSION(S) / ED DIAGNOSES  Final diagnoses:  Upper respiratory tract infection, unspecified type  Nasal congestion     ED Discharge Orders         Ordered    azithromycin (ZITHROMAX)  250 MG tablet  Daily     10/26/18 0520    Dextromethorphan-guaiFENesin 5-100 MG/5ML LIQD  3 times daily PRN     10/26/18 0520           Note:  This document was prepared using Dragon voice recognition software and may include unintentional dictation errors.    Irean Hong, MD 10/26/18 [phone]

## 2018-10-26 NOTE — ED Notes (Signed)
Waiting on medication from pharmacy

## 2019-10-15 ENCOUNTER — Encounter: Payer: Self-pay | Admitting: Emergency Medicine

## 2019-10-15 ENCOUNTER — Emergency Department: Payer: 59

## 2019-10-15 ENCOUNTER — Emergency Department
Admission: EM | Admit: 2019-10-15 | Discharge: 2019-10-15 | Disposition: A | Discharge status: Home / Self Care | Payer: 59 | Attending: Emergency Medicine | Admitting: Emergency Medicine

## 2019-10-15 ENCOUNTER — Other Ambulatory Visit: Payer: Self-pay

## 2019-10-15 DIAGNOSIS — R070 Pain in throat: Secondary | ICD-10-CM | POA: Insufficient documentation

## 2019-10-15 DIAGNOSIS — B20 Human immunodeficiency virus [HIV] disease: Secondary | ICD-10-CM | POA: Insufficient documentation

## 2019-10-15 DIAGNOSIS — Z79899 Other long term (current) drug therapy: Secondary | ICD-10-CM | POA: Diagnosis not present

## 2019-10-15 DIAGNOSIS — J069 Acute upper respiratory infection, unspecified: Secondary | ICD-10-CM | POA: Insufficient documentation

## 2019-10-15 DIAGNOSIS — Z20828 Contact with and (suspected) exposure to other viral communicable diseases: Secondary | ICD-10-CM | POA: Insufficient documentation

## 2019-10-15 DIAGNOSIS — F17220 Nicotine dependence, chewing tobacco, uncomplicated: Secondary | ICD-10-CM | POA: Diagnosis not present

## 2019-10-15 DIAGNOSIS — J40 Bronchitis, not specified as acute or chronic: Secondary | ICD-10-CM | POA: Diagnosis not present

## 2019-10-15 DIAGNOSIS — F17210 Nicotine dependence, cigarettes, uncomplicated: Secondary | ICD-10-CM | POA: Diagnosis not present

## 2019-10-15 DIAGNOSIS — R0981 Nasal congestion: Secondary | ICD-10-CM | POA: Diagnosis not present

## 2019-10-15 DIAGNOSIS — R05 Cough: Secondary | ICD-10-CM | POA: Diagnosis present

## 2019-10-15 DIAGNOSIS — R0602 Shortness of breath: Secondary | ICD-10-CM | POA: Diagnosis not present

## 2019-10-15 LAB — CBC
HCT: 41.4 % (ref 39.0–52.0)
Hemoglobin: 13.9 g/dL (ref 13.0–17.0)
MCH: 33.1 pg (ref 26.0–34.0)
MCHC: 33.6 g/dL (ref 30.0–36.0)
MCV: 98.6 fL (ref 80.0–100.0)
Platelets: 250 10*3/uL (ref 150–400)
RBC: 4.2 MIL/uL — ABNORMAL LOW (ref 4.22–5.81)
RDW: 13.3 % (ref 11.5–15.5)
WBC: 12.6 10*3/uL — ABNORMAL HIGH (ref 4.0–10.5)
nRBC: 0 % (ref 0.0–0.2)

## 2019-10-15 LAB — BASIC METABOLIC PANEL
Anion gap: 11 (ref 5–15)
BUN: 13 mg/dL (ref 6–20)
CO2: 20 mmol/L — ABNORMAL LOW (ref 22–32)
Calcium: 8.8 mg/dL — ABNORMAL LOW (ref 8.9–10.3)
Chloride: 108 mmol/L (ref 98–111)
Creatinine, Ser: 1 mg/dL (ref 0.61–1.24)
GFR calc Af Amer: >60 mL/min (ref >60)
GFR calc non Af Amer: >60 mL/min (ref >60)
Glucose, Bld: 116 mg/dL — ABNORMAL HIGH (ref 70–99)
Potassium: 3.5 mmol/L (ref 3.5–5.1)
Sodium: 139 mmol/L (ref 135–145)

## 2019-10-15 LAB — TROPONIN I (HIGH SENSITIVITY): Troponin I (High Sensitivity): 2 ng/L (ref <18)

## 2019-10-15 LAB — SARS CORONAVIRUS 2 (TAT 6-24 HRS): SARS Coronavirus 2: NEGATIVE

## 2019-10-15 MED ORDER — ALBUTEROL SULFATE HFA 108 (90 BASE) MCG/ACT IN AERS
2.0000 | INHALATION_SPRAY | Freq: Once | RESPIRATORY_TRACT | Status: AC
  Administered 2019-10-15: 2 via RESPIRATORY_TRACT
  Filled 2019-10-15: qty 6.7

## 2019-10-15 MED ORDER — PREDNISONE 20 MG PO TABS: 60.0000 mg | ORAL_TABLET | Freq: Every day | ORAL | 0 refills | Status: AC

## 2019-10-15 MED ORDER — BENZONATATE 100 MG PO CAPS
100.0000 mg | ORAL_CAPSULE | Freq: Once | ORAL | Status: AC
  Administered 2019-10-15: 100 mg via ORAL
  Filled 2019-10-15: qty 1

## 2019-10-15 MED ORDER — BENZONATATE 100 MG PO CAPS: 100.0000 mg | ORAL_CAPSULE | Freq: Three times a day (TID) | ORAL | 0 refills | Status: DC | PRN

## 2019-10-15 MED ORDER — AZITHROMYCIN 500 MG PO TABS: 500.0000 mg | ORAL_TABLET | Freq: Every day | ORAL | 0 refills | Status: AC

## 2019-10-15 MED ORDER — AZITHROMYCIN 500 MG PO TABS
500.0000 mg | ORAL_TABLET | Freq: Once | ORAL | Status: AC
  Administered 2019-10-15: 500 mg via ORAL
  Filled 2019-10-15: qty 1

## 2019-10-15 NOTE — ED Triage Notes (Signed)
Reports productive cough of clear sputum x 1 week with shortness of breath, sore throat, stuffy nose and chest pain; pain to the center of his chest; does not know if he's been exposed to COVID: ambulatory with steady gait; talking in complete coherent sentences;

## 2019-10-16 NOTE — ED Provider Notes (Signed)
[facility] Emergency Department Provider Note    First MD Initiated Contact with Patient 10/15/19 [phone]     (approximate)  I have reviewed the triage vital signs and the nursing notes.   HISTORY  Chief Complaint Cough, Chest Pain, Sore Throat, and Shortness of Breath   HPI Lance Reynolds is a 43 y.o. male with below list of previous medical conditions presents to the emergency department secondary to productive cough x1 week shortness of breath sore throats nasal congestion and chest discomfort.  Patient denies any known sick contacts        Past Medical History:  Diagnosis Date  . Anxiety   . Depression   . GERD (gastroesophageal reflux disease)   . HIV positive ([place])   . Immune deficiency disorder [facility])     Patient Active Problem List   Diagnosis Date Noted  . Herpetic gingivostomatitis 09/21/2015  . HIV disease ([place]) 09/21/2015  . Cellulitis of scrotum 09/21/2015  . Cellulitis 09/18/2015    History reviewed. No pertinent surgical history.  Prior to Admission medications   Medication Sig Start Date End Date Taking? Authorizing Provider  elvitegravir-cobicistat-emtricitabine-tenofovir (GENVOYA) 150-150-200-10 MG TABS tablet Take 1 tablet by mouth daily with breakfast.   Yes [provider]  omeprazole (PRILOSEC) 40 MG capsule Take 40 mg by mouth daily.   Yes [provider]  azithromycin (ZITHROMAX) 500 MG tablet Take 1 tablet (500 mg total) by mouth daily for 3 days. Take 1 tablet daily for 3 days. 10/15/19 10/18/19  Gregor Hams, MD  benzonatate (TESSALON PERLES) 100 MG capsule Take 1 capsule (100 mg total) by mouth 3 (three) times daily as needed. 10/15/19 10/14/20  Gregor Hams, MD  predniSONE (DELTASONE) 20 MG tablet Take 3 tablets (60 mg total) by mouth daily for 5 days. 10/15/19 10/20/19  Gregor Hams, MD    Allergies Hydrocodone, Tramadol, and Percocet [oxycodone-acetaminophen]  History reviewed. No  pertinent family history.  Social History Social History   Tobacco Use  . Smoking status: Current Some Day Smoker    Years: 26.00    Types: Cigarettes  . Smokeless tobacco: Current User    Types: Chew  Substance Use Topics  . Alcohol use: Yes  . Drug use: No    Review of Systems Constitutional: No fever/chills Eyes: No visual changes. ENT: No sore throat.  Positive for nasal congestion Cardiovascular: Denies chest pain. Respiratory: Positive for cough Gastrointestinal: No abdominal pain.  No nausea, no vomiting.  No diarrhea.  No constipation. Genitourinary: Negative for dysuria. Musculoskeletal: Negative for neck pain.  Negative for back pain. Integumentary: Negative for rash. Neurological: Negative for headaches, focal weakness or numbness.   ____________________________________________   PHYSICAL EXAM:  VITAL SIGNS: ED Triage Vitals  Enc Vitals Group     BP 10/15/19 0044 128/79     Pulse Rate 10/15/19 0044 99     Resp 10/15/19 0044 17     Temp 10/15/19 0044 99.2 F (37.3 C)     Temp Source 10/15/19 0044 Oral     SpO2 10/15/19 0044 98 %     Weight 10/15/19 0046 95.3 kg (210 lb)     Height 10/15/19 0046 1.803 m (5\' 11" )     Head Circumference --      Peak Flow --      Pain Score 10/15/19 0045 7     Pain Loc --      Pain Edu? --      Excl.  in GC? --     Constitutional: Alert and oriented.  Eyes: Conjunctivae are normal.  Head: Atraumatic. Mouth/Throat: Patient is wearing a mask. Neck: No stridor.  No meningeal signs.   Cardiovascular: Normal rate, regular rhythm. Good peripheral circulation. Grossly normal heart sounds. Respiratory: Normal respiratory effort.  No retractions. Gastrointestinal: Soft and nontender. No distention.  Musculoskeletal: No lower extremity tenderness nor edema. No gross deformities of extremities. Neurologic:  Normal speech and language. No gross focal neurologic deficits are appreciated.  Skin:  Skin is warm, dry and intact.  Psychiatric: Mood and affect are normal. Speech and behavior are normal.  ____________________________________________   LABS (all labs ordered are listed, but only abnormal results are displayed)  Labs Reviewed  BASIC METABOLIC PANEL - Abnormal; Notable for the following components:      Result Value   CO2 20 (*)    Glucose, Bld 116 (*)    Calcium 8.8 (*)    All other components within normal limits  CBC - Abnormal; Notable for the following components:   WBC 12.6 (*)    RBC 4.20 (*)    All other components within normal limits  SARS CORONAVIRUS 2 (TAT 6-24 HRS)  TROPONIN I (HIGH SENSITIVITY)  TROPONIN I (HIGH SENSITIVITY)   ____________________________________________  EKG  ED ECG REPORT I, [place] N Genae Strine, the attending physician, personally viewed and interpreted this ECG.   Date: 10/16/2019  EKG Time: 12:33 AM  Rate: 95  Rhythm: Normal sinus rhythm  Axis: Normal  Intervals: Normal  ST&T Change: None ____________________________________________  RADIOLOGY I, [place] N Jodye Scali, personally viewed and evaluated these images (plain radiographs) as part of my medical decision making, as well as reviewing the written report by the radiologist.  ED MD interpretation: No active disease noted on chest x-ray.  Official radiology report(s): No results found.    Procedures   ____________________________________________   INITIAL IMPRESSION / MDM / ASSESSMENT AND PLAN / ED COURSE  As part of my medical decision making, I reviewed the following data within the electronic MEDICAL RECORD NUMBER 43 year old male presenting with above-stated history and physical exam consistent with bronchitis versus pneumonia versus COVID-19 infection.  COVID-19 testing performed.  Chest x-ray revealed no evidence of pneumonia.  Suspect bronchitis in a smoker.    ____________________________________________  FINAL CLINICAL IMPRESSION(S) / ED DIAGNOSES  Final diagnoses:  Upper  respiratory tract infection, unspecified type  Bronchitis     MEDICATIONS GIVEN DURING THIS VISIT:  Medications  albuterol (VENTOLIN HFA) 108 (90 Base) MCG/ACT inhaler 2 puff (2 puffs Inhalation Given 10/15/19 0603)  azithromycin (ZITHROMAX) tablet 500 mg (500 mg Oral Given 10/15/19 0547)  benzonatate (TESSALON) capsule 100 mg (100 mg Oral Given 10/15/19 0547)     ED Discharge Orders         Ordered    azithromycin (ZITHROMAX) 500 MG tablet  Daily     10/15/19 0607    predniSONE (DELTASONE) 20 MG tablet  Daily     10/15/19 0607    benzonatate (TESSALON PERLES) 100 MG capsule  3 times daily PRN     10/15/19 9741          *Please note:  NORI POLAND was evaluated in Emergency Department on 10/16/2019 for the symptoms described in the history of present illness. He was evaluated in the context of the global COVID-19 pandemic, which necessitated consideration that the patient might be at risk for infection with the SARS-CoV-2 virus that causes COVID-19. Institutional protocols and algorithms that  pertain to the evaluation of patients at risk for COVID-19 are in a state of rapid change based on information released by regulatory bodies including the CDC and federal and state organizations. These policies and algorithms were followed during the patient's care in the ED.  Some ED evaluations and interventions may be delayed as a result of limited staffing during the pandemic.*  Note:  This document was prepared using Dragon voice recognition software and may include unintentional dictation errors.   Darci CurrentBrown, [place] N, MD 10/16/19 0730

## 2019-12-11 ENCOUNTER — Other Ambulatory Visit: Payer: Self-pay

## 2019-12-11 ENCOUNTER — Encounter: Payer: Self-pay | Admitting: Emergency Medicine

## 2019-12-11 ENCOUNTER — Emergency Department: Payer: No Typology Code available for payment source | Attending: Emergency Medicine

## 2019-12-11 ENCOUNTER — Emergency Department
Admission: EM | Admit: 2019-12-11 | Discharge: 2019-12-11 | Disposition: A | Discharge status: Home / Self Care | Payer: No Typology Code available for payment source | Attending: Student | Admitting: Student

## 2019-12-11 DIAGNOSIS — Y9259 Other trade areas as the place of occurrence of the external cause: Secondary | ICD-10-CM | POA: Diagnosis not present

## 2019-12-11 DIAGNOSIS — Z79899 Other long term (current) drug therapy: Secondary | ICD-10-CM | POA: Insufficient documentation

## 2019-12-11 DIAGNOSIS — Z21 Asymptomatic human immunodeficiency virus [HIV] infection status: Secondary | ICD-10-CM | POA: Insufficient documentation

## 2019-12-11 DIAGNOSIS — S0990XA Unspecified injury of head, initial encounter: Secondary | ICD-10-CM | POA: Diagnosis present

## 2019-12-11 DIAGNOSIS — W378XXA Explosion and rupture of other pressurized tire, pipe or hose, initial encounter: Secondary | ICD-10-CM | POA: Insufficient documentation

## 2019-12-11 DIAGNOSIS — F17210 Nicotine dependence, cigarettes, uncomplicated: Secondary | ICD-10-CM | POA: Insufficient documentation

## 2019-12-11 DIAGNOSIS — Y990 Civilian activity done for income or pay: Secondary | ICD-10-CM | POA: Insufficient documentation

## 2019-12-11 DIAGNOSIS — S0083XA Contusion of other part of head, initial encounter: Secondary | ICD-10-CM | POA: Diagnosis not present

## 2019-12-11 DIAGNOSIS — Y9389 Activity, other specified: Secondary | ICD-10-CM | POA: Insufficient documentation

## 2019-12-11 MED ORDER — TETRACAINE HCL 0.5 % OP SOLN
1.0000 [drp] | Freq: Once | OPHTHALMIC | Status: AC
  Administered 2019-12-11: 1 [drp] via OPHTHALMIC
  Filled 2019-12-11: qty 4

## 2019-12-11 MED ORDER — POLYMYXIN B-TRIMETHOPRIM 10000-0.1 UNIT/ML-% OP SOLN: 1.0000 [drp] | OPHTHALMIC | 0 refills | Status: AC
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
## 2019-12-11 NOTE — ED Notes (Signed)
Pt is supposed to wear glasses but does not have them with him

## 2019-12-11 NOTE — ED Notes (Signed)
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  Pt states he was seen at [facility] clinic prior to coming here and he took the UDS already.  Pt provided paperwork showing WC UDS and BAC was complete.
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
## 2019-12-25 ENCOUNTER — Other Ambulatory Visit: Payer: Self-pay

## 2019-12-25 ENCOUNTER — Emergency Department
Admission: EM | Admit: 2019-12-25 | Discharge: 2019-12-25 | Disposition: A | Discharge status: Home / Self Care | Payer: No Typology Code available for payment source | Attending: Emergency Medicine | Admitting: Emergency Medicine

## 2019-12-25 ENCOUNTER — Emergency Department: Payer: No Typology Code available for payment source

## 2019-12-25 DIAGNOSIS — Z79899 Other long term (current) drug therapy: Secondary | ICD-10-CM | POA: Diagnosis not present

## 2019-12-25 DIAGNOSIS — M25461 Effusion, right knee: Secondary | ICD-10-CM | POA: Diagnosis not present

## 2019-12-25 DIAGNOSIS — F17210 Nicotine dependence, cigarettes, uncomplicated: Secondary | ICD-10-CM | POA: Insufficient documentation

## 2019-12-25 DIAGNOSIS — B20 Human immunodeficiency virus [HIV] disease: Secondary | ICD-10-CM | POA: Insufficient documentation

## 2019-12-25 DIAGNOSIS — M25561 Pain in right knee: Secondary | ICD-10-CM | POA: Diagnosis present

## 2019-12-25 MED ORDER — MELOXICAM 7.5 MG PO TABS: 7.5000 mg | ORAL_TABLET | Freq: Every day | ORAL | 0 refills | Status: AC

## 2019-12-25 NOTE — ED Triage Notes (Signed)
Pt c/o right knee pain since Friday morning,. States he is up and down on his knees a lot, non known injury

## 2019-12-25 NOTE — Discharge Instructions (Signed)
Wear a knee sleeve when you are out of bed.  Try and change the way you position yourself when you are working so that your knee is not in contact with the concrete. Buying a knee pad may also be helpful.  Follow up with primary care  or orthopedics if not improving over the next week or so. Take the medication as prescribed.

## 2019-12-25 NOTE — ED Provider Notes (Signed)
[facility] Emergency Department Provider Note ____________________________________________  Time seen: Approximately 12:55 PM  I have reviewed the triage vital signs and the nursing notes.   HISTORY  Chief Complaint Knee Pain    HPI Lance Reynolds is a 44 y.o. male who presents to the emergency department for evaluation and treatment of nontraumatic knee pain.  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  No specific injury.  Pain was present upon awakening 3 days ago. No previous knee injury. Some relief with ice.   Past Medical History:  Diagnosis Date  . Anxiety   . Depression   . GERD (gastroesophageal reflux disease)   . HIV positive (H[facility])   . Immune deficiency disorder [facility])     Patient Active Problem List   Diagnosis Date Noted  . Herpetic gingivostomatitis 09/21/2015  . HIV disease (H[facility]) 09/21/2015  . Cellulitis of scrotum 09/21/2015  . Cellulitis 09/18/2015    History reviewed. No pertinent surgical history.  Prior to Admission medications   Medication Sig Start Date End Date Taking? Authorizing Provider  elvitegravir-cobicistat-emtricitabine-tenofovir (GENVOYA) 150-150-200-10 MG TABS tablet Take 1 tablet by mouth daily with breakfast.    [provider]  meloxicam (MOBIC) 7.5 MG tablet Take 1 tablet (7.5 mg total) by mouth daily. 12/25/19 12/24/20  Caprice Wasko, Kasandra Knudsen, FNP  omeprazole (PRILOSEC) 40 MG capsule Take 40 mg by mouth daily.    [provider]    Allergies Hydrocodone, Tramadol, and Percocet [oxycodone-acetaminophen]  No family history on file.  Social History Social History   Tobacco Use  . Smoking status: Current Some Day Smoker    Years: 26.00    Types: Cigarettes  . Smokeless tobacco: Current User    Types: Chew  Substance Use Topics  . Alcohol use: Yes  . Drug use: No    Review of Systems Constitutional: Negative for  fever. Cardiovascular: Negative for chest pain. Respiratory: Negative for shortness of breath. Musculoskeletal: Positive for right knee pain. Skin: Negative for open wounds or lesions.  Neurological: Negative for decrease in sensation  ____________________________________________   PHYSICAL EXAM:  VITAL SIGNS: ED Triage Vitals  Enc Vitals Group     BP 12/25/19 1225 (!) 132/95     Pulse Rate 12/25/19 1225 88     Resp 12/25/19 1225 16     Temp 12/25/19 1225 97.9 F (36.6 C)     Temp Source 12/25/19 1225 Oral     SpO2 12/25/19 1225 98 %     Weight 12/25/19 1227 210 lb (95.3 kg)     Height 12/25/19 1227 5\' 9"  (1.753 m)     Head Circumference --      Peak Flow --      Pain Score 12/25/19 1227 10     Pain Loc --      Pain Edu? --      Excl. in GC? --     Constitutional: Alert and oriented. Well appearing and in no acute distress. Eyes: Conjunctivae are clear without discharge or drainage Head: Atraumatic Neck: Supple Respiratory: No cough. Respirations are even and unlabored. Musculoskeletal: Right knee pain increases with flexion. No focal tenderness.  Neurologic: Motor and sensory function intact.  Skin: Hypertrophic skin overlying proximal tibia just below knee. No open wounds, lesion, or swelling noted.  Psychiatric: Affect and behavior are appropriate.  ____________________________________________   LABS (all labs ordered are listed, but only abnormal results  are displayed)  Labs Reviewed - No data to display ____________________________________________  RADIOLOGY  Right knee shows small joint effusion, otherwise normal imaging. ____________________________________________   PROCEDURES  Procedures  ____________________________________________   INITIAL IMPRESSION / ASSESSMENT AND PLAN / ED COURSE  Lance Reynolds is a 44 y.o. who presents to the emergency department for treatment and evaluation of right knee pain without known injury.  He will be  treated with meloxicam and advised to get a knee sleeve.  Patient instructed to follow-up with orthopedics if not improving over the week.  He was also instructed to return to the emergency department for symptoms that change or worsen if unable schedule an appointment with orthopedics or primary care.  Medications - No data to display  Pertinent labs & imaging results that were available during my care of the patient were reviewed by me and considered in my medical decision making (see chart for details).  _________________________________________   FINAL CLINICAL IMPRESSION(S) / ED DIAGNOSES  Final diagnoses:  Knee effusion, right    ED Discharge Orders         Ordered    meloxicam (MOBIC) 7.5 MG tablet  Daily     12/25/19 1344           If controlled substance prescribed during this visit, 12 month history viewed on the [place] prior to issuing an initial prescription for Schedule II or III opiod.   Victorino Dike, FNP 12/25/19 1452    Nance Pear, MD 12/25/19 1511

## 2019-12-25 NOTE — ED Notes (Signed)
Pt reports work up Friday morning and his right knee was hurting. Pt denies obvious injuries. Pt reports pain is around knee cap and hurts to bend it. Pt reports pain is a throbbing pain. Slight swelling noted but no obvious injuries noted.

## 2020-08-30 ENCOUNTER — Emergency Department
Admission: EM | Admit: 2020-08-30 | Discharge: 2020-08-30 | Discharge status: Against Medical Advice | Payer: No Typology Code available for payment source

## 2021-01-23 ENCOUNTER — Other Ambulatory Visit: Payer: Self-pay

## 2021-01-23 ENCOUNTER — Emergency Department
Admission: EM | Admit: 2021-01-23 | Discharge: 2021-01-23 | Disposition: A | Discharge status: Home / Self Care | Payer: No Typology Code available for payment source | Attending: Emergency Medicine | Admitting: Emergency Medicine

## 2021-01-23 DIAGNOSIS — F17210 Nicotine dependence, cigarettes, uncomplicated: Secondary | ICD-10-CM | POA: Diagnosis not present

## 2021-01-23 DIAGNOSIS — T6594XA Toxic effect of unspecified substance, undetermined, initial encounter: Secondary | ICD-10-CM | POA: Diagnosis present

## 2021-01-23 DIAGNOSIS — Z21 Asymptomatic human immunodeficiency virus [HIV] infection status: Secondary | ICD-10-CM | POA: Insufficient documentation

## 2021-01-23 DIAGNOSIS — Z77098 Contact with and (suspected) exposure to other hazardous, chiefly nonmedicinal, chemicals: Secondary | ICD-10-CM

## 2021-01-23 MED ORDER — FLUORESCEIN SODIUM 1 MG OP STRP
1.0000 | ORAL_STRIP | Freq: Once | OPHTHALMIC | Status: AC
  Administered 2021-01-23: 1 via OPHTHALMIC
  Filled 2021-01-23: qty 1

## 2021-01-23 MED ORDER — KETOROLAC TROMETHAMINE 0.5 % OP SOLN: 1.0000 [drp] | Freq: Four times a day (QID) | OPHTHALMIC | 0 refills | Status: AC
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
## 2021-01-23 NOTE — ED Notes (Signed)
Urine Drug Screen performed 1615, delivered to lab 1630.

## 2021-01-23 NOTE — ED Notes (Signed)
Eye exam per PA with woods lamp
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
## 2021-07-30 ENCOUNTER — Other Ambulatory Visit (HOSPITAL_COMMUNITY): Payer: Self-pay

## 2021-07-30 ENCOUNTER — Other Ambulatory Visit: Payer: Self-pay

## 2021-07-30 ENCOUNTER — Telehealth: Payer: Self-pay

## 2021-07-30 ENCOUNTER — Ambulatory Visit: Payer: Self-pay

## 2021-07-30 DIAGNOSIS — Z79899 Other long term (current) drug therapy: Secondary | ICD-10-CM

## 2021-07-30 DIAGNOSIS — Z113 Encounter for screening for infections with a predominantly sexual mode of transmission: Secondary | ICD-10-CM

## 2021-07-30 DIAGNOSIS — B20 Human immunodeficiency virus [HIV] disease: Secondary | ICD-10-CM

## 2021-07-30 NOTE — Telephone Encounter (Signed)
[facility] Patient Advocate Encounter ? ?Insurance verification completed.   ? ?The patient is uninsured and will need patient assistance for medication. ? ?We can complete the application and will need to meet with the patient for signatures and income documentation. ? ?Emari Demmer, CPhT ?Specialty Pharmacy Patient Advocate ?[facility] ?Phone: [phone] ?Fax:  [phone]  ?

## 2021-07-31 LAB — URINE CYTOLOGY ANCILLARY ONLY
Chlamydia: NEGATIVE
Comment: NEGATIVE
Comment: NORMAL
Neisseria Gonorrhea: NEGATIVE

## 2021-07-31 LAB — URINALYSIS
Bilirubin Urine: NEGATIVE
Glucose, UA: NEGATIVE
Hgb urine dipstick: NEGATIVE
Ketones, ur: NEGATIVE
Leukocytes,Ua: NEGATIVE
Nitrite: NEGATIVE
Protein, ur: NEGATIVE
Specific Gravity, Urine: 1.025 (ref 1.001–1.035)
pH: 6 (ref 5.0–8.0)

## 2021-07-31 LAB — T-HELPER CELL (CD4) - (RCID CLINIC ONLY)
CD4 % Helper T Cell: 39 % (ref 33–65)
CD4 T Cell Abs: 948 /uL (ref 400–1790)

## 2021-08-03 LAB — CBC WITH DIFFERENTIAL/PLATELET
Absolute Monocytes: 544 {cells}/uL (ref 200–950)
Basophils Absolute: 60 {cells}/uL (ref 0–200)
Basophils Relative: 0.7 %
Eosinophils Absolute: 238 {cells}/uL (ref 15–500)
Eosinophils Relative: 2.8 %
HCT: 44.3 % (ref 38.5–50.0)
Hemoglobin: 14.9 g/dL (ref 13.2–17.1)
Lymphs Abs: 2567 {cells}/uL (ref 850–3900)
MCH: 32.7 pg (ref 27.0–33.0)
MCHC: 33.6 g/dL (ref 32.0–36.0)
MCV: 97.1 fL (ref 80.0–100.0)
MPV: 10.2 fL (ref 7.5–12.5)
Monocytes Relative: 6.4 %
Neutro Abs: 5092 {cells}/uL (ref 1500–7800)
Neutrophils Relative %: 59.9 %
Platelets: 301 Thousand/uL (ref 140–400)
RBC: 4.56 Million/uL (ref 4.20–5.80)
RDW: 12.3 % (ref 11.0–15.0)
Total Lymphocyte: 30.2 %
WBC: 8.5 Thousand/uL (ref 3.8–10.8)

## 2021-08-03 LAB — COMPLETE METABOLIC PANEL WITHOUT GFR
AG Ratio: 1.7 (calc) (ref 1.0–2.5)
ALT: 14 U/L (ref 9–46)
AST: 12 U/L (ref 10–40)
Albumin: 4.3 g/dL (ref 3.6–5.1)
Alkaline phosphatase (APISO): 60 U/L (ref 36–130)
BUN: 23 mg/dL (ref 7–25)
CO2: 26 mmol/L (ref 20–32)
Calcium: 9.7 mg/dL (ref 8.6–10.3)
Chloride: 107 mmol/L (ref 98–110)
Creat: 1.14 mg/dL (ref 0.60–1.29)
Globulin: 2.6 g/dL (ref 1.9–3.7)
Glucose, Bld: 73 mg/dL (ref 65–99)
Potassium: 4.6 mmol/L (ref 3.5–5.3)
Sodium: 140 mmol/L (ref 135–146)
Total Bilirubin: 0.6 mg/dL (ref 0.2–1.2)
Total Protein: 6.9 g/dL (ref 6.1–8.1)
eGFR: 80 mL/min/1.73m2

## 2021-08-03 LAB — HLA B*5701: HLA-B*5701 w/rflx HLA-B High: NEGATIVE

## 2021-08-03 LAB — HEPATITIS B SURFACE ANTIBODY,QUALITATIVE: Hep B S Ab: REACTIVE — AB

## 2021-08-03 LAB — QUANTIFERON-TB GOLD PLUS
Mitogen-NIL: 4.32 [IU]/mL
NIL: 0.02 [IU]/mL
QuantiFERON-TB Gold Plus: NEGATIVE
TB1-NIL: 0.01 [IU]/mL
TB2-NIL: 0 [IU]/mL

## 2021-08-03 LAB — RPR TITER: RPR Titer: 1:4 {titer} — ABNORMAL HIGH

## 2021-08-03 LAB — RPR: RPR Ser Ql: REACTIVE — AB

## 2021-08-03 LAB — HEPATITIS A ANTIBODY, TOTAL: Hepatitis A AB,Total: REACTIVE — AB

## 2021-08-03 LAB — HIV-1 RNA ULTRAQUANT REFLEX TO GENTYP+
HIV 1 RNA Quant: NOT DETECTED {copies}/mL
HIV-1 RNA Quant, Log: NOT DETECTED {Log_copies}/mL

## 2021-08-03 LAB — HEPATITIS B CORE ANTIBODY, TOTAL: Hep B Core Total Ab: NONREACTIVE

## 2021-08-03 LAB — LIPID PANEL
Cholesterol: 205 mg/dL — ABNORMAL HIGH
HDL: 40 mg/dL
Non-HDL Cholesterol (Calc): 165 mg/dL — ABNORMAL HIGH
Total CHOL/HDL Ratio: 5.1 (calc) — ABNORMAL HIGH
Triglycerides: 429 mg/dL — ABNORMAL HIGH

## 2021-08-03 LAB — HIV-1/2 AB - DIFFERENTIATION
HIV-1 antibody: POSITIVE — AB
HIV-2 Ab: NEGATIVE

## 2021-08-03 LAB — HEPATITIS C ANTIBODY
Hepatitis C Ab: NONREACTIVE
SIGNAL TO CUT-OFF: 0.01

## 2021-08-03 LAB — HIV ANTIBODY (ROUTINE TESTING W REFLEX): HIV 1&2 Ab, 4th Generation: REACTIVE — AB

## 2021-08-03 LAB — FLUORESCENT TREPONEMAL AB(FTA)-IGG-BLD: Fluorescent Treponemal ABS: REACTIVE — AB

## 2021-08-03 LAB — HEPATITIS B SURFACE ANTIGEN: Hepatitis B Surface Ag: NONREACTIVE

## 2021-08-20 ENCOUNTER — Encounter: Payer: Self-pay | Admitting: Family

## 2021-08-25 ENCOUNTER — Ambulatory Visit (INDEPENDENT_AMBULATORY_CARE_PROVIDER_SITE_OTHER): Payer: Self-pay | Admitting: Pharmacist

## 2021-08-25 ENCOUNTER — Other Ambulatory Visit: Payer: Self-pay

## 2021-08-25 ENCOUNTER — Ambulatory Visit (INDEPENDENT_AMBULATORY_CARE_PROVIDER_SITE_OTHER): Payer: Self-pay | Admitting: Family

## 2021-08-25 ENCOUNTER — Encounter: Payer: Self-pay | Admitting: Family

## 2021-08-25 VITALS — BP 127/83 | HR 84 | Temp 97.7°F | Ht 69.0 in | Wt 205.0 lb

## 2021-08-25 DIAGNOSIS — B20 Human immunodeficiency virus [HIV] disease: Secondary | ICD-10-CM

## 2021-08-25 DIAGNOSIS — Z0000 Encounter for general adult medical examination without abnormal findings: Secondary | ICD-10-CM | POA: Insufficient documentation

## 2021-08-25 DIAGNOSIS — Z113 Encounter for screening for infections with a predominantly sexual mode of transmission: Secondary | ICD-10-CM

## 2021-08-25 MED ORDER — ELVITEG-COBIC-EMTRICIT-TENOFAF 150-150-200-10 MG PO TABS: 1.0000 | ORAL_TABLET | Freq: Every day | ORAL | 5 refills | Status: DC

## 2021-08-25 NOTE — Progress Notes (Signed)
Brief Narrative   Patient ID: Lance Reynolds, male    DOB: [birth year], 45 y.o.   MRN: [id number]  Lance Reynolds is a 45 y/o caucasian gentleman diagnosed with HIV disease in 2012 with risk factor of MSM. Initial CD4 count, viral load and genotype are unknown. [id number] negative. No history of opportunistic infection. Genvoya since initial diagnosis. Clinic entry lab work with undetectable viral load and CD4 count of 948.  Subjective:    Chief Complaint  Patient presents with   New Patient (Initial Visit)    Transferring care, declined condoms     HPI:  Lance Reynolds is a 45 y.o. male with HIV disease, anxiety, depression and GERD presenting today to transfer care of his HIV disease.  Lance Reynolds was initially diagnosed with HIV in 20102 with risk factor of MSM. Initial viral load and CD4 count are unknown. No history of opportunistic infection. Has been on Genvoya since initial diagnosis without problems. Continues to take his Genovya daily as prescribed with the occasional missed dose. Has no problems obtaining medication and is covered through [employer] receiving medications through mail. Denies feelings of being down, depressed or hopeless recently. No current recreational or illicit drug use or alcohol consumption. Vapes on a daily basis. Currently working a full time job and has secure housing and good access to food and resources. Covid vaccines are up to date.   Lance Reynolds initial clinic lab work completed on 07/30/2021 with confirmatory HIV testing with positive HIV-1 antibody.  Viral load was undetectable with CD4 count of 948.  QuanttiFERON gold and HLA B5 701 negative.  Urine was negative for gonorrhea and chlamydia.  RPR titer positive at 1: 4.  Previously treated at 1: 67 in 2016 for health department.  Kidney function, liver function, electrolytes within normal ranges.  Lipid profile with HDL of 40 and triglycerides of 429.   Allergies  Allergen Reactions   Hydrocodone Nausea Only    Tramadol Nausea And Vomiting   Percocet [Oxycodone-Acetaminophen] Nausea And Vomiting      Outpatient Medications Prior to Visit  Medication Sig Dispense Refill   acetaminophen (TYLENOL) 325 MG tablet Take by mouth.     omeprazole (PRILOSEC) 40 MG capsule Take 40 mg by mouth daily.     elvitegravir-cobicistat-emtricitabine-tenofovir (GENVOYA) 150-150-200-10 MG TABS tablet Take 1 tablet by mouth daily with breakfast.     No facility-administered medications prior to visit.     Past Medical History:  Diagnosis Date   Anxiety    Depression    GERD (gastroesophageal reflux disease)    HIV positive ([place])    Immune deficiency disorder ([place])      History reviewed. No pertinent surgical history.    Review of Systems  Constitutional:  Negative for appetite change, chills, fatigue, fever and unexpected weight change.  Eyes:  Negative for visual disturbance.  Respiratory:  Negative for cough, chest tightness, shortness of breath and wheezing.   Cardiovascular:  Negative for chest pain and leg swelling.  Gastrointestinal:  Negative for abdominal pain, constipation, diarrhea, nausea and vomiting.  Genitourinary:  Negative for dysuria, flank pain, frequency, genital sores, hematuria and urgency.  Skin:  Negative for rash.  Allergic/Immunologic: Negative for immunocompromised state.  Neurological:  Negative for dizziness and headaches.     Objective:    BP 127/83   Pulse 84   Temp 97.7 F (36.5 C) (Oral)   Ht _0  (1.753 m)   Wt 205 lb (93 kg)  SpO2 96%   BMI 30.27 kg/m  Nursing note and vital signs reviewed.  Physical Exam Constitutional:      General: He is not in acute distress.    Appearance: He is well-developed.  Eyes:     Conjunctiva/sclera: Conjunctivae normal.  Cardiovascular:     Rate and Rhythm: Normal rate and regular rhythm.     Heart sounds: Normal heart sounds. No murmur heard.   No friction rub. No gallop.  Pulmonary:     Effort: Pulmonary effort  is normal. No respiratory distress.     Breath sounds: Normal breath sounds. No wheezing or rales.  Chest:     Chest wall: No tenderness.  Abdominal:     General: Bowel sounds are normal.     Palpations: Abdomen is soft.     Tenderness: There is no abdominal tenderness.  Musculoskeletal:     Cervical back: Neck supple.  Lymphadenopathy:     Cervical: No cervical adenopathy.  Skin:    General: Skin is warm and dry.     Findings: No rash.  Neurological:     Mental Status: He is alert and oriented to person, place, and time.  Psychiatric:        Behavior: Behavior normal.        Thought Content: Thought content normal.        Judgment: Judgment normal.     Depression screen PHQ 2/9 08/25/2021  Decreased Interest 0  Down, Depressed, Hopeless 0  PHQ - 2 Score 0       Assessment & Plan:    Patient Active Problem List   Diagnosis Date Noted   Healthcare maintenance 08/25/2021   Herpetic gingivostomatitis 09/21/2015   HIV disease ([place]) 09/21/2015   Cellulitis of scrotum 09/21/2015   Cellulitis 09/18/2015     Problem List Items Addressed This Visit       Other   HIV disease ([place]) - Primary    Lance Reynolds is a 45 y/o caucasian male diagnosed with HIV disease in 2012 with risk factor of MSM. Initial CD4 count and viral load are unknown. No history of opportunistic infection. [id number] and Quantiferon Gold negative. Has well controlled virus with good adherence and tolerance to his ART regimen of Genvoya which he has been on since initial diagnosis. No signs/symptoms of opportunistic infection. Reviewed lab work and provided clinic orientation. Continue current dose of Genvoya. Plan for follow up in 3 months or sooner if needed with lab work 1-2 weeks prior to appointment.       Relevant Medications   elvitegravir-cobicistat-emtricitabine-tenofovir (GENVOYA) 150-150-200-10 MG TABS tablet   Other Relevant Orders   HIV-1 RNA quant-no reflex-bld   T-helper cell (CD4)- ([facility] clinic  only)   Healthcare maintenance    Discussed importance of safe sexual practice and condom usage. Condoms declined.  Covid vaccines up to date. Will attempt to get vaccine records from previous provider.       Other Visit Diagnoses     Screening for STDs (sexually transmitted diseases)       Relevant Orders   RPR        I am having Lance Reynolds maintain his omeprazole, acetaminophen, and elvitegravir-cobicistat-emtricitabine-tenofovir.   Meds ordered this encounter  Medications   elvitegravir-cobicistat-emtricitabine-tenofovir (GENVOYA) 150-150-200-10 MG TABS tablet    Sig: Take 1 tablet by mouth daily with breakfast.    Dispense:  30 tablet    Refill:  5    Do not fill until a refill is requested  please.    Order Specific Question:   Supervising Provider    Answer:   Carlyle Basques [[id number]]     Follow-up: Return in about 3 months (around 11/24/2021), or if symptoms worsen or fail to improve.   Terri Piedra, MSN, FNP-C Nurse Practitioner [facility] [place] number: [phone]

## 2021-08-25 NOTE — Assessment & Plan Note (Signed)
Lance Reynolds is a 45 y/o caucasian male diagnosed with HIV disease in 2012 with risk factor of MSM. Initial CD4 count and viral load are unknown. No history of opportunistic infection. [id number] and Quantiferon Gold negative. Has well controlled virus with good adherence and tolerance to his ART regimen of Genvoya which he has been on since initial diagnosis. No signs/symptoms of opportunistic infection. Reviewed lab work and provided clinic orientation. Continue current dose of Genvoya. Plan for follow up in 3 months or sooner if needed with lab work 1-2 weeks prior to appointment.

## 2021-08-25 NOTE — Progress Notes (Signed)
08/25/2021  HPI: Lance Reynolds is a 45 y.o. male who presents to the [facility] clinic to transfer care to [facility] for his HIV infection.  Patient Active Problem List   Diagnosis Date Noted   Healthcare maintenance 08/25/2021   Herpetic gingivostomatitis 09/21/2015   HIV disease (HCC) 09/21/2015   Cellulitis of scrotum 09/21/2015   Cellulitis 09/18/2015    Patient's Medications  New Prescriptions   No medications on file  Previous Medications   ACETAMINOPHEN (TYLENOL) 325 MG TABLET    Take by mouth.   ELVITEGRAVIR-COBICISTAT-EMTRICITABINE-TENOFOVIR (GENVOYA) 150-150-200-10 MG TABS TABLET    Take 1 tablet by mouth daily with breakfast.   OMEPRAZOLE (PRILOSEC) 40 MG CAPSULE    Take 40 mg by mouth daily.  Modified Medications   No medications on file  Discontinued Medications   No medications on file    Allergies: Allergies  Allergen Reactions   Hydrocodone Nausea Only   Tramadol Nausea And Vomiting   Percocet [Oxycodone-Acetaminophen] Nausea And Vomiting    Past Medical History: Past Medical History:  Diagnosis Date   Anxiety    Depression    GERD (gastroesophageal reflux disease)    HIV positive (HCC)    Immune deficiency disorder (HCC)     Social History: Social History   Socioeconomic History   Marital status: Single    Spouse name: Not on file   Number of children: Not on file   Years of education: Not on file   Highest education level: Not on file  Occupational History   Not on file  Tobacco Use   Smoking status: Former    Years: 26.00    Types: Cigarettes   Smokeless tobacco: Current    Types: Chew   Tobacco comments:    Currently vapes   Vaping Use   Vaping Use: Every day  Substance and Sexual Activity   Alcohol use: Yes    Comment: rarely   Drug use: No   Sexual activity: Not on file  Other Topics Concern   Not on file  Social History Narrative   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   Social Determinants of Health    Financial Resource Strain: Not on file  Food Insecurity: Not on file  Transportation Needs: Not on file  Physical Activity: Not on file  Stress: Not on file  Social Connections: Not on file    Labs: Lab Results  Component Value Date   HIV1RNAQUANT NOT DETECTED 07/30/2021   CD4TABS 948 07/30/2021    RPR and STI Lab Results  Component Value Date   LABRPR REACTIVE (A) 07/30/2021   LABRPR Reactive (A) 09/18/2015   RPRTITER 1:4 (H) 07/30/2021    STI Results GC CT  07/30/2021 Negative Negative    Hepatitis B Lab Results  Component Value Date   HEPBSAB REACTIVE (A) 07/30/2021   HEPBSAG NON-REACTIVE 07/30/2021   HEPBCAB NON-REACTIVE 07/30/2021   Hepatitis C Lab Results  Component Value Date   HEPCAB NON-REACTIVE 07/30/2021   Hepatitis A Lab Results  Component Value Date   HAV REACTIVE (A) 07/30/2021   Lipids: Lab Results  Component Value Date   CHOL 205 (H) 07/30/2021   TRIG 429 (H) 07/30/2021   HDL 40 07/30/2021   CHOLHDL 5.1 (H) 07/30/2021   LDLCALC  07/30/2021     Comment:     . LDL cholesterol not calculated. Triglyceride levels greater than 400 mg/dL invalidate calculated LDL results. . Reference range: <100 . Desirable range <100 mg/dL  for primary prevention;   <70 mg/dL for patients with CHD or diabetic patients  with > or = 2 CHD risk factors. Marland Kitchen LDL-C is now calculated using the Martin-Hopkins  calculation, which is a validated novel method providing  better accuracy than the Friedewald equation in the  estimation of LDL-C.  Horald Pollen et al. Lenox Ahr. 8786;767(20): 2061-2068  (http://education.[facility].com/faq/FAQ164)     Current HIV Regimen: Genvoya  Assessment: Patient currently takes Genvoya without any issues or side effects. He is applying for [employer] but states that he has 3 bottles at home. No issues tolerating it. Gave him my card and a pill box keychain. Told him to call me if he needs anything in the  future.  Plan: - Continue Genvoya  Jaymin Waln L. Colston Pyle, PharmD, BCIDP, AAHIVP, CPP Clinical Pharmacist Practitioner Infectious Diseases Clinical Pharmacist [facility] 08/25/2021, 2:41 PM

## 2021-08-25 NOTE — Assessment & Plan Note (Signed)
   Discussed importance of safe sexual practice and condom usage. Condoms declined.   Covid vaccines up to date.  Will attempt to get vaccine records from previous provider.

## 2021-08-25 NOTE — Patient Instructions (Addendum)
Nice to see you.  Continue to take your medications daily as prescribed.   Refills have been sent to the pharmacy.  Get your paperwork to financial counselors  Plan for follow up in 3 months or sooner if needed with lab work 1-2 weeks prior to appointment.   Have a great day and stay safe!

## 2021-11-25 ENCOUNTER — Other Ambulatory Visit: Payer: Self-pay

## 2021-11-25 DIAGNOSIS — Z113 Encounter for screening for infections with a predominantly sexual mode of transmission: Secondary | ICD-10-CM

## 2021-11-25 DIAGNOSIS — B20 Human immunodeficiency virus [HIV] disease: Secondary | ICD-10-CM

## 2021-11-26 LAB — T-HELPER CELL (CD4) - (RCID CLINIC ONLY)
CD4 % Helper T Cell: 46 % (ref 33–65)
CD4 T Cell Abs: 981 /uL (ref 400–1790)

## 2021-11-27 LAB — HIV-1 RNA QUANT-NO REFLEX-BLD
HIV 1 RNA Quant: 22 Copies/mL — ABNORMAL HIGH
HIV-1 RNA Quant, Log: 1.35 Log cps/mL — ABNORMAL HIGH

## 2021-11-27 LAB — RPR TITER: RPR Titer: 1:2 {titer} — ABNORMAL HIGH

## 2021-11-27 LAB — FLUORESCENT TREPONEMAL AB(FTA)-IGG-BLD: Fluorescent Treponemal ABS: REACTIVE — AB

## 2021-11-27 LAB — RPR: RPR Ser Ql: REACTIVE — AB

## 2021-11-28 ENCOUNTER — Telehealth: Payer: Self-pay

## 2021-11-28 NOTE — Telephone Encounter (Signed)
[facility] called to confirm patient's regimen as he has not filled his Genvoya since 09/02/21. Per notes, patient is to continue Genvoya and follow up at the end of December. Will make provider aware.   Sandie Ano, RN

## 2021-12-09 ENCOUNTER — Encounter: Payer: Self-pay | Admitting: Family

## 2022-03-04 ENCOUNTER — Ambulatory Visit (INDEPENDENT_AMBULATORY_CARE_PROVIDER_SITE_OTHER): Payer: Self-pay | Admitting: Infectious Disease

## 2022-03-04 ENCOUNTER — Encounter: Payer: Self-pay | Admitting: Infectious Disease

## 2022-03-04 ENCOUNTER — Other Ambulatory Visit: Payer: Self-pay

## 2022-03-04 VITALS — BP 125/84 | HR 86 | Temp 97.5°F | Wt 211.0 lb

## 2022-03-04 DIAGNOSIS — G4700 Insomnia, unspecified: Secondary | ICD-10-CM

## 2022-03-04 DIAGNOSIS — Z23 Encounter for immunization: Secondary | ICD-10-CM

## 2022-03-04 DIAGNOSIS — B20 Human immunodeficiency virus [HIV] disease: Secondary | ICD-10-CM

## 2022-03-04 HISTORY — DX: Insomnia, unspecified: G47.00

## 2022-03-04 MED ORDER — ELVITEG-COBIC-EMTRICIT-TENOFAF 150-150-200-10 MG PO TABS: 1.0000 | ORAL_TABLET | Freq: Every day | ORAL | 5 refills | Status: DC

## 2022-03-04 MED ORDER — TRAZODONE HCL 50 MG PO TABS: 50.0000 mg | ORAL_TABLET | Freq: Every evening | ORAL | 5 refills | Status: DC | PRN

## 2022-03-04 NOTE — Progress Notes (Signed)
? ?Subjective:  ?Chief complaint follow-up for HIV disease on Genvoya ? ? Patient ID: [facility], male    DOB: 09/04/1976, 46 y.o.   MRN: [id number] ? ?HPI ? ?Lance Reynolds is a 46 year old Caucasian man living with HIV that has been well controlled on Genvoya.  He previously was seen at the [facility] in [place] by Alwyn Pea, now has moved to [place] and is shifted over to our clinic at [facility]. ? ?Was seeing Terri Piedra but wished to switch providers. ? ?He has renewed his HIV medication assistance program. ? ?He had apparently been on a statin at one point in time but is not on one currently. ? ? ? ? ?Past Medical History:  ?Diagnosis Date  ? Anxiety   ? Depression   ? GERD (gastroesophageal reflux disease)   ? HIV positive ([place])   ? Immune deficiency disorder ([name])   ? ? ?No past surgical history on file. ? ?No family history on file. ? ?  ?Social History  ? ?Socioeconomic History  ? Marital status: Single  ?  Spouse name: Not on file  ? Number of children: Not on file  ? Years of education: Not on file  ? Highest education level: Not on file  ?Occupational History  ? Not on file  ?Tobacco Use  ? Smoking status: Former  ?  Years: 26.00  ?  Types: Cigarettes  ? Smokeless tobacco: Current  ?  Types: Chew  ?Vaping Use  ? Vaping Use: Every day  ?Substance and Sexual Activity  ? Alcohol use: Yes  ?  Comment: rarely  ? Drug use: No  ? Sexual activity: Yes  ?  Partners: Male  ?  Comment: declined condoms  ?Other Topics Concern  ? Not on file  ?Social History Narrative  ? 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 ?Social Determinants of Health  ? ?Financial Resource Strain: Not on file  ?Food Insecurity: Not on file  ?Transportation Needs: Not on file  ?Physical Activity: Not on file  ?Stress: Not on file  ?Social Connections: Not on file  ? ? ?Allergies  ?Allergen Reactions  ? Hydrocodone Nausea Only  ? Tramadol Nausea And Vomiting  ? Percocet [Oxycodone-Acetaminophen] Nausea And Vomiting   ? ? ? ?Current Outpatient Medications:  ?  acetaminophen (TYLENOL) 325 MG tablet, Take by mouth., Disp: , Rfl:  ?  elvitegravir-cobicistat-emtricitabine-tenofovir (GENVOYA) 150-150-200-10 MG TABS tablet, Take 1 tablet by mouth daily with breakfast., Disp: 30 tablet, Rfl: 5 ?  omeprazole (PRILOSEC) 40 MG capsule, Take 40 mg by mouth daily., Disp: , Rfl:  ? ? ?Review of Systems  ?Constitutional:  Negative for activity change, appetite change, chills, diaphoresis, fatigue, fever and unexpected weight change.  ?HENT:  Negative for congestion, rhinorrhea, sinus pressure, sneezing, sore throat and trouble swallowing.   ?Eyes:  Negative for photophobia and visual disturbance.  ?Respiratory:  Negative for cough, chest tightness, shortness of breath, wheezing and stridor.   ?Cardiovascular:  Negative for chest pain, palpitations and leg swelling.  ?Gastrointestinal:  Negative for abdominal distention, abdominal pain, anal bleeding, blood in stool, constipation, diarrhea, nausea and vomiting.  ?Genitourinary:  Negative for difficulty urinating, dysuria, flank pain and hematuria.  ?Musculoskeletal:  Negative for arthralgias, back pain, gait problem, joint swelling and myalgias.  ?Skin:  Negative for color change, pallor, rash and wound.  ?Neurological:  Negative for dizziness, tremors, weakness and light-headedness.  ?Hematological:  Negative for adenopathy. Does not bruise/bleed easily.  ?Psychiatric/Behavioral:  Positive for sleep disturbance. Negative for agitation, behavioral problems, confusion, decreased concentration and dysphoric mood.   ? ?   ?Objective:  ? Physical Exam ?Constitutional:   ?   Appearance: He is well-developed.  ?HENT:  ?   Head: Normocephalic and atraumatic.  ?Eyes:  ?   Conjunctiva/sclera: Conjunctivae normal.  ?Cardiovascular:  ?   Rate and Rhythm: Normal rate and regular rhythm.  ?Pulmonary:  ?   Effort: Pulmonary effort is normal. No respiratory distress.  ?   Breath sounds: No wheezing.   ?Abdominal:  ?   General: There is no distension.  ?   Palpations: Abdomen is soft.  ?Musculoskeletal:     ?   General: No tenderness. Normal range of motion.  ?   Cervical back: Normal range of motion and neck supple.  ?Skin: ?   General: Skin is warm and dry.  ?   Coloration: Skin is not pale.  ?   Findings: No erythema or rash.  ?Neurological:  ?   General: No focal deficit present.  ?   Mental Status: He is alert and oriented to person, place, and time.  ?Psychiatric:     ?   Mood and Affect: Mood normal.     ?   Behavior: Behavior normal.     ?   Thought Content: Thought content normal.     ?   Judgment: Judgment normal.  ? ? ? ? ? ?   ?Assessment & Plan:  ? ?HIV disease: ? ?Check viral load CD4 count CMP CBC with differential ? ?Continue his Genvoya.  Discussed the idea of eventually getting him off of a booster containing regimen due to the risk of drug drug interactions. ? ?History of hyperlipidemia recheck direct LDL since she ate some food around lunchtime. ? ? ?Insomnia: At times has difficulty sleeping the past was prescribed some to help him sleep so try trazodone for him ? ?Vaccine counseling recommend that he receive updated Prevnar 20 ? ?

## 2022-03-07 LAB — COMPLETE METABOLIC PANEL WITH GFR
AG Ratio: 1.6 (calc) (ref 1.0–2.5)
ALT: 13 U/L (ref 9–46)
AST: 15 U/L (ref 10–40)
Albumin: 4.5 g/dL (ref 3.6–5.1)
Alkaline phosphatase (APISO): 58 U/L (ref 36–130)
BUN: 13 mg/dL (ref 7–25)
CO2: 26 mmol/L (ref 20–32)
Calcium: 9.7 mg/dL (ref 8.6–10.3)
Chloride: 105 mmol/L (ref 98–110)
Creat: 1.17 mg/dL (ref 0.60–1.29)
Globulin: 2.8 g/dL (calc) (ref 1.9–3.7)
Glucose, Bld: 84 mg/dL (ref 65–99)
Potassium: 3.6 mmol/L (ref 3.5–5.3)
Sodium: 141 mmol/L (ref 135–146)
Total Bilirubin: 1 mg/dL (ref 0.2–1.2)
Total Protein: 7.3 g/dL (ref 6.1–8.1)
eGFR: 78 mL/min/{1.73_m2} (ref >=60)

## 2022-03-07 LAB — CBC WITH DIFFERENTIAL/PLATELET
Absolute Monocytes: 442 cells/uL (ref 200–950)
Basophils Absolute: 48 cells/uL (ref 0–200)
Basophils Relative: 0.7 %
Eosinophils Absolute: 179 cells/uL (ref 15–500)
Eosinophils Relative: 2.6 %
HCT: 42.4 % (ref 38.5–50.0)
Hemoglobin: 14.6 g/dL (ref 13.2–17.1)
Lymphs Abs: 2588 cells/uL (ref 850–3900)
MCH: 32.7 pg (ref 27.0–33.0)
MCHC: 34.4 g/dL (ref 32.0–36.0)
MCV: 95.1 fL (ref 80.0–100.0)
MPV: 10.5 fL (ref 7.5–12.5)
Monocytes Relative: 6.4 %
Neutro Abs: 3643 cells/uL (ref 1500–7800)
Neutrophils Relative %: 52.8 %
Platelets: 297 10*3/uL (ref 140–400)
RBC: 4.46 10*6/uL (ref 4.20–5.80)
RDW: 12.6 % (ref 11.0–15.0)
Total Lymphocyte: 37.5 %
WBC: 6.9 10*3/uL (ref 3.8–10.8)

## 2022-03-07 LAB — HIV-1 RNA QUANT-NO REFLEX-BLD
HIV 1 RNA Quant: NOT DETECTED Copies/mL
HIV-1 RNA Quant, Log: NOT DETECTED Log cps/mL

## 2022-03-07 LAB — LDL CHOLESTEROL, DIRECT: Direct LDL: 154 mg/dL — ABNORMAL HIGH (ref <100)

## 2022-03-07 LAB — RPR TITER: RPR Titer: 1:2 {titer} — ABNORMAL HIGH

## 2022-03-07 LAB — FLUORESCENT TREPONEMAL AB(FTA)-IGG-BLD: Fluorescent Treponemal ABS: REACTIVE — AB

## 2022-03-07 LAB — RPR: RPR Ser Ql: REACTIVE — AB

## 2022-11-02 ENCOUNTER — Ambulatory Visit: Payer: Self-pay | Admitting: Infectious Disease

## 2022-11-02 ENCOUNTER — Encounter: Payer: Self-pay | Admitting: Infectious Disease

## 2022-11-02 DIAGNOSIS — Z7185 Encounter for immunization safety counseling: Secondary | ICD-10-CM

## 2022-11-02 DIAGNOSIS — E785 Hyperlipidemia, unspecified: Secondary | ICD-10-CM | POA: Insufficient documentation

## 2022-11-02 HISTORY — DX: Encounter for immunization safety counseling: Z71.85

## 2022-11-02 HISTORY — DX: Hyperlipidemia, unspecified: E78.5

## 2022-11-02 NOTE — Progress Notes (Deleted)
   Subjective:  Chief complaint follow-up for HIV disease on Genvoya   Patient ID: [facility], male    DOB: Mar 04, 1976, 46 y.o.   MRN: [id number]  HPI  Lance [facility] is a 46 year old Caucasian man living with HIV that has been well controlled on Genvoya.  He previously was seen at the [facility] clinic in [place] by Eula Flax, now has moved to [place] and is shifted over to our clinic at [facility].       Past Medical History:  Diagnosis Date   Anxiety    Depression    GERD (gastroesophageal reflux disease)    HIV positive (HCC)    Immune deficiency disorder (HCC)    Insomnia 03/04/2022    No past surgical history on file.  No family history on file.    Social History   Socioeconomic History   Marital status: Single    Spouse name: Not on file   Number of children: Not on file   Years of education: Not on file   Highest education level: Not on file  Occupational History   Not on file  Tobacco Use   Smoking status: Former    Years: 26.00    Types: Cigarettes   Smokeless tobacco: Current    Types: Chew  Vaping Use   Vaping Use: Every day  Substance and Sexual Activity   Alcohol use: Yes    Comment: rarely   Drug use: No   Sexual activity: Yes    Partners: Male    Comment: declined condoms  Other Topics Concern   Not on file  Social History Narrative   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   Social Determinants of Health   Financial Resource Strain: Not on file  Food Insecurity: Not on file  Transportation Needs: Not on file  Physical Activity: Not on file  Stress: Not on file  Social Connections: Not on file    Allergies  Allergen Reactions   Hydrocodone Nausea Only   Tramadol Nausea And Vomiting   Percocet [Oxycodone-Acetaminophen] Nausea And Vomiting     Current Outpatient Medications:    acetaminophen (TYLENOL) 325 MG tablet, Take by mouth., Disp: , Rfl:    elvitegravir-cobicistat-emtricitabine-tenofovir (GENVOYA)  150-150-200-10 MG TABS tablet, Take 1 tablet by mouth daily with breakfast., Disp: 30 tablet, Rfl: 5   omeprazole (PRILOSEC) 40 MG capsule, Take 40 mg by mouth daily., Disp: , Rfl:    traZODone (DESYREL) 50 MG tablet, Take 1 tablet (50 mg total) by mouth at bedtime as needed for sleep., Disp: 30 tablet, Rfl: 5   Review of Systems     Objective:   Physical Exam        Assessment & Plan:   I will add order HIV viral load CD4 count CBC with differential CMP, RPR GC and chlamydia and I will continue  [facility]'s   prescription   Hyperlipidemia: based on REPRIEVE study should be on a statin    Vaccine counseling: recommended updated flu and COVID vaccines

## 2022-12-16 ENCOUNTER — Emergency Department
Admission: EM | Admit: 2022-12-16 | Discharge: 2022-12-16 | Disposition: A | Discharge status: Home / Self Care | Payer: Self-pay | Attending: Emergency Medicine | Admitting: Emergency Medicine

## 2022-12-16 ENCOUNTER — Other Ambulatory Visit: Payer: Self-pay

## 2022-12-16 DIAGNOSIS — J029 Acute pharyngitis, unspecified: Secondary | ICD-10-CM | POA: Insufficient documentation

## 2022-12-16 DIAGNOSIS — H6122 Impacted cerumen, left ear: Secondary | ICD-10-CM | POA: Insufficient documentation

## 2022-12-16 DIAGNOSIS — Z1152 Encounter for screening for COVID-19: Secondary | ICD-10-CM | POA: Insufficient documentation

## 2022-12-16 LAB — GROUP A STREP BY PCR: Group A Strep by PCR: NOT DETECTED

## 2022-12-16 LAB — RESP PANEL BY RT-PCR (RSV, FLU A&B, COVID)  RVPGX2
Influenza A by PCR: NEGATIVE
Influenza B by PCR: NEGATIVE
Resp Syncytial Virus by PCR: NEGATIVE
SARS Coronavirus 2 by RT PCR: NEGATIVE

## 2022-12-16 MED ORDER — AMOXICILLIN 875 MG PO TABS: 875.0000 mg | ORAL_TABLET | Freq: Two times a day (BID) | ORAL | 0 refills | Status: DC

## 2022-12-16 NOTE — ED Notes (Signed)
Discharged by other RN. Steady gait noted.

## 2022-12-16 NOTE — ED Provider Notes (Signed)
[facility] Provider Note    Event Date/Time   First MD Initiated Contact with Patient 12/16/22 1430     (approximate)   History   Sore Throat, Nasal Congestion, and Ear Pain   HPI  Lance Reynolds is a 47 y.o. male with history of HIV GERD, hyperlipidemia [facility] emergency department with sore throat, cough and right ear pain.  Symptoms for 3 days.  No vomiting or diarrhea.  No chest pain or shortness of breath      Physical Exam   Triage Vital Signs: ED Triage Vitals  Enc Vitals Group     BP 12/16/22 1404 (!) 127/100     Pulse Rate 12/16/22 1404 87     Resp 12/16/22 1404 15     Temp 12/16/22 1404 98 F (36.7 C)     Temp Source 12/16/22 1404 Oral     SpO2 12/16/22 1404 97 %     Weight 12/16/22 1406 205 lb (93 kg)     Height 12/16/22 1406 5\' 9"  (1.753 m)     Head Circumference --      Peak Flow --      Pain Score 12/16/22 1406 4     Pain Loc --      Pain Edu? --      Excl. in [place]? --     Most recent vital signs: Vitals:   12/16/22 1404  BP: (!) 127/100  Pulse: 87  Resp: 15  Temp: 98 F (36.7 C)  SpO2: 97%     General: Awake, no distress.   CV:  Good peripheral perfusion. regular rate and  rhythm Resp:  Normal effort. Lungs cta Abd:  No distention.   Other:  Right ear appears to be slightly red at the TM, there is some wax buildup and obstruction, left TM unable to assess due to cerumen, throat is bright red and swollen, neck is supple, no lymphadenopathy   ED Results / Procedures / Treatments   Labs (all labs ordered are listed, but only abnormal results are displayed) Labs Reviewed  GROUP A STREP BY PCR  RESP PANEL BY RT-PCR (RSV, FLU A&B, COVID)  [id number]     EKG     RADIOLOGY     PROCEDURES:   Procedures   MEDICATIONS ORDERED IN ED: Medications - No data to display   IMPRESSION / MDM / [place] / ED COURSE  I reviewed the triage vital signs and the nursing notes.                               Differential diagnosis includes, but is not limited to, strep throat, COVID, influenza, RSV  Patient's presentation is most consistent with acute complicated illness / injury requiring diagnostic workup.   Respiratory panel strep test pending  I did explain the findings to the patient.  Due to his depressed immune system I feel that we will go ahead and treat him with amoxicillin.  He is to throw away his toothbrush in 3 days.  Return emergency department if worsening.  Gargle with warm salt water.  He is in agreement treatment plan.  Discharged stable condition.   Respiratory panel and strep test are both negative   FINAL CLINICAL IMPRESSION(S) / ED DIAGNOSES   Final diagnoses:  Acute pharyngitis, unspecified etiology     Rx / DC Orders   ED Discharge Orders  Ordered    amoxicillin (AMOXIL) 875 MG tablet  2 times daily        12/16/22 1436             Note:  This document was prepared using Dragon voice recognition software and may include unintentional dictation errors.    Versie Starks, PA-C 12/16/22 1756    Naaman Plummer, MD 12/17/22 [phone]

## 2022-12-16 NOTE — Discharge Instructions (Addendum)
Follow-up with your regular doctor if not improving in 3 days.  Return emergency department worsening.  Take antibiotic as prescribed Gargle with warm salt water to help ease the pain in your throat, it is a natural antiseptic Discard your toothbrush in 3 days so that you do not reinfect yourself

## 2022-12-16 NOTE — ED Triage Notes (Signed)
Pt to ED for dry cough, sore throat and runny nose since 3 days. Pt alert, ambulatory. States hard to sleep at night. States now L ear is hurting since this AM "like something popped".

## 2023-02-05 ENCOUNTER — Telehealth: Payer: Self-pay

## 2023-02-05 NOTE — Telephone Encounter (Signed)
Received call from [facility], reports patient has not dispensed Genvoya since 10/2022 and wanted to confirm that patient is to continue on regimen. Advised patient should continue with Genvoya per provider's note. Will notify provider regarding lapse in medication.   Beryle Flock, RN

## 2023-02-05 NOTE — Telephone Encounter (Signed)
Agree that Genvoya isn't a good option! Happy to see him next week.

## 2023-02-10 ENCOUNTER — Ambulatory Visit (INDEPENDENT_AMBULATORY_CARE_PROVIDER_SITE_OTHER): Payer: Self-pay | Admitting: Pharmacist

## 2023-02-10 ENCOUNTER — Other Ambulatory Visit: Payer: Self-pay

## 2023-02-10 DIAGNOSIS — B20 Human immunodeficiency virus [HIV] disease: Secondary | ICD-10-CM

## 2023-02-10 MED ORDER — BIKTARVY 50-200-25 MG PO TABS: 1.0000 | ORAL_TABLET | Freq: Every day | ORAL | 2 refills | Status: DC

## 2023-02-10 NOTE — Progress Notes (Cosign Needed)
02/10/2023  HPI: [facility] is a 47 y.o. male who presents to the [place] clinic for HIV follow-up.  Patient Active Problem List   Diagnosis Date Noted   Hyperlipidemia 11/02/2022   Vaccine counseling 11/02/2022   Insomnia 03/04/2022   Healthcare maintenance 08/25/2021   Acid reflux 06/27/2017   VZV (varicella-zoster virus) infection 06/26/2017   Herpetic gingivostomatitis 09/21/2015   HIV (human immunodeficiency virus infection) ([place]) 09/21/2015   Cellulitis of scrotum 09/21/2015   Cellulitis 09/18/2015    Patient's Medications  New Prescriptions   No medications on file  Previous Medications   ACETAMINOPHEN (TYLENOL) 325 MG TABLET    Take by mouth.   AMOXICILLIN (AMOXIL) 875 MG TABLET    Take 1 tablet (875 mg total) by mouth 2 (two) times daily.   ELVITEGRAVIR-COBICISTAT-EMTRICITABINE-TENOFOVIR (GENVOYA) 150-150-200-10 MG TABS TABLET    Take 1 tablet by mouth daily with breakfast.   OMEPRAZOLE (PRILOSEC) 40 MG CAPSULE    Take 40 mg by mouth daily.   TRAZODONE (DESYREL) 50 MG TABLET    Take 1 tablet (50 mg total) by mouth at bedtime as needed for sleep.  Modified Medications   No medications on file  Discontinued Medications   No medications on file    Allergies: Allergies  Allergen Reactions   Hydrocodone Nausea Only   Tramadol Nausea And Vomiting   Percocet [Oxycodone-Acetaminophen] Nausea And Vomiting    Past Medical History: Past Medical History:  Diagnosis Date   Anxiety    Depression    GERD (gastroesophageal reflux disease)    HIV positive (HCC)    Hyperlipidemia 11/02/2022   Immune deficiency disorder ([place])    Insomnia 03/04/2022   Vaccine counseling 11/02/2022    Social History: Social History   Socioeconomic History   Marital status: Single    Spouse name: Not on file   Number of children: Not on file   Years of education: Not on file   Highest education level: Not on file  Occupational History   Not on file  Tobacco Use    Smoking status: Former    Years: 26.00    Types: Cigarettes   Smokeless tobacco: Current    Types: Nurse, children's Use: Every day  Substance and Sexual Activity   Alcohol use: Yes    Comment: rarely   Drug use: No   Sexual activity: Yes    Partners: Male    Comment: declined condoms  Other Topics Concern   Not on file  Social History Narrative   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   Social Determinants of Health   Financial Resource Strain: Not on file  Food Insecurity: Not on file  Transportation Needs: Not on file  Physical Activity: Not on file  Stress: Not on file  Social Connections: Not on file    Labs: Lab Results  Component Value Date   HIV1RNAQUANT Not Detected 03/04/2022   HIV1RNAQUANT 22 (H) 11/25/2021   HIV1RNAQUANT NOT DETECTED 07/30/2021   CD4TABS 981 11/25/2021   CD4TABS 948 07/30/2021    RPR and STI Lab Results  Component Value Date   LABRPR REACTIVE (A) 03/04/2022   LABRPR REACTIVE (A) 11/25/2021   LABRPR REACTIVE (A) 07/30/2021   LABRPR Reactive (A) 09/18/2015   RPRTITER 1:2 (H) 03/04/2022   RPRTITER 1:2 (H) 11/25/2021   RPRTITER 1:4 (H) 07/30/2021    STI Results GC CT  07/30/2021 11:25 AM Negative  Negative  Hepatitis B Lab Results  Component Value Date   HEPBSAB REACTIVE (A) 07/30/2021   HEPBSAG NON-REACTIVE 07/30/2021   HEPBCAB NON-REACTIVE 07/30/2021   Hepatitis C Lab Results  Component Value Date   HEPCAB NON-REACTIVE 07/30/2021   Hepatitis A Lab Results  Component Value Date   HAV REACTIVE (A) 07/30/2021   Lipids: Lab Results  Component Value Date   CHOL 205 (H) 07/30/2021   TRIG 429 (H) 07/30/2021   HDL 40 07/30/2021   CHOLHDL 5.1 (H) 07/30/2021   LDLCALC  07/30/2021     Comment:     . LDL cholesterol not calculated. Triglyceride levels greater than 400 mg/dL invalidate calculated LDL results. . Reference range: <100 . Desirable range <100 mg/dL for primary prevention;    <70 mg/dL for patients with CHD or diabetic patients  with > or = 2 CHD risk factors. Marland Kitchen LDL-C is now calculated using the Martin-Hopkins  calculation, which is a validated novel method providing  better accuracy than the Friedewald equation in the  estimation of LDL-C.  Cresenciano Genre et al. Annamaria Helling. [id number]): 2061-2068  (http://education.[employer].com/faq/FAQ164)     Current HIV Regimen: Genvoya  Assessment: [facility] presents today for adherence counseling and labs after missing multiple appointments recently. He reports that he still has a few days of Genvoya left because he has been taking them every other day. Counseled him to never ever take his medication every other day as this puts him at risk for resistance. Counseled on the importance of taking his medication every single day and coming in for appointments to maintain control of his HIV. As he is at risk for resistance to [facility], we will switch him to [facility] for now. Provided 1 week of samples to tie him over until [place] arrives from his mail order pharmacy. Collect HIV RNA, resistance testing, CD4, BMP and CBC.   He is in a monogamous relationship with his girlfriend who is also HIV+ and declines STI testing today. He is also eligible for mpox, COVID and Tdap vaccination today. He is interested but declines these today as he does not want to be sore for work this afternoon.  Plan: - Collect HIV RNA, CD4, BMP, CBC today - [place]  - Appointment scheduled with Dr. Drucilla Schmidt on 03/15/23 for HIV f/u and evaluation of new regimen.   Titus Dubin, PharmD PGY1 Pharmacy Resident 02/10/2023 8:47 AM

## 2023-02-11 LAB — T-HELPER CELL (CD4) - (RCID CLINIC ONLY)
CD4 % Helper T Cell: 44 % (ref 33–65)
CD4 T Cell Abs: 636 /uL (ref 400–1790)

## 2023-02-16 LAB — CBC WITH DIFFERENTIAL/PLATELET
Absolute Monocytes: 337 cells/uL (ref 200–950)
Basophils Absolute: 41 cells/uL (ref 0–200)
Basophils Relative: 0.8 %
Eosinophils Absolute: 148 cells/uL (ref 15–500)
Eosinophils Relative: 2.9 %
HCT: 42.5 % (ref 38.5–50.0)
Hemoglobin: 14.5 g/dL (ref 13.2–17.1)
Lymphs Abs: 1846 cells/uL (ref 850–3900)
MCH: 32.4 pg (ref 27.0–33.0)
MCHC: 34.1 g/dL (ref 32.0–36.0)
MCV: 94.9 fL (ref 80.0–100.0)
MPV: 9.9 fL (ref 7.5–12.5)
Monocytes Relative: 6.6 %
Neutro Abs: 2729 cells/uL (ref 1500–7800)
Neutrophils Relative %: 53.5 %
Platelets: 320 10*3/uL (ref 140–400)
RBC: 4.48 10*6/uL (ref 4.20–5.80)
RDW: 12 % (ref 11.0–15.0)
Total Lymphocyte: 36.2 %
WBC: 5.1 10*3/uL (ref 3.8–10.8)

## 2023-02-16 LAB — BASIC METABOLIC PANEL
BUN: 13 mg/dL (ref 7–25)
CO2: 29 mmol/L (ref 20–32)
Calcium: 9.6 mg/dL (ref 8.6–10.3)
Chloride: 108 mmol/L (ref 98–110)
Creat: 1.01 mg/dL (ref 0.60–1.29)
Glucose, Bld: 79 mg/dL (ref 65–99)
Potassium: 4.2 mmol/L (ref 3.5–5.3)
Sodium: 142 mmol/L (ref 135–146)

## 2023-02-16 LAB — HIV RNA, RTPCR W/R GT (RTI, PI,INT)
HIV 1 RNA Quant: NOT DETECTED copies/mL
HIV-1 RNA Quant, Log: NOT DETECTED Log copies/mL

## 2023-03-14 ENCOUNTER — Encounter: Payer: Self-pay | Admitting: Infectious Disease

## 2023-03-14 DIAGNOSIS — B20 Human immunodeficiency virus [HIV] disease: Secondary | ICD-10-CM

## 2023-03-14 HISTORY — DX: Human immunodeficiency virus (HIV) disease: B20

## 2023-03-14 NOTE — Progress Notes (Unsigned)
   Subjective:  Chief complaint: follow-up for HIV disease on medications    Patient ID: Lance [facility], male    DOB: October 16, 1976, 47 y.o.   MRN: [id number]  HPI  47 year old man living with HIV who was running out of his Genvoya and taking it QOD. He was seen by Magda Kiel our ID pharmacist, VL drawn and he was changed to [facility]. Fortunately for him his VL was still <20 despite his taking Genvoya QOD and fortunately he did not fail an INSTI regimen with R    Past Medical History:  Diagnosis Date   Anxiety    Depression    GERD (gastroesophageal reflux disease)    HIV positive ([place])    Hyperlipidemia 11/02/2022   Immune deficiency disorder ([place])    Insomnia 03/04/2022   Vaccine counseling 11/02/2022    No past surgical history on file.  No family history on file.    Social History   Socioeconomic History   Marital status: Single    Spouse name: Not on file   Number of children: Not on file   Years of education: Not on file   Highest education level: Not on file  Occupational History   Not on file  Tobacco Use   Smoking status: Former    Years: 26    Types: Cigarettes   Smokeless tobacco: Current    Types: Chew  Vaping Use   Vaping Use: Every day  Substance and Sexual Activity   Alcohol use: Yes    Comment: rarely   Drug use: No   Sexual activity: Yes    Partners: Male    Comment: declined condoms  Other Topics Concern   Not on file  Social History Narrative   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   Social Determinants of Health   Financial Resource Strain: Not on file  Food Insecurity: Not on file  Transportation Needs: Not on file  Physical Activity: Not on file  Stress: Not on file  Social Connections: Not on file    Allergies  Allergen Reactions   Hydrocodone Nausea Only   Tramadol Nausea And Vomiting   Percocet [Oxycodone-Acetaminophen] Nausea And Vomiting     Current Outpatient Medications:    acetaminophen  (TYLENOL) 325 MG tablet, Take by mouth., Disp: , Rfl:    amoxicillin (AMOXIL) 875 MG tablet, Take 1 tablet (875 mg total) by mouth 2 (two) times daily., Disp: 20 tablet, Rfl: 0   bictegravir-emtricitabine-tenofovir AF (BIKTARVY) 50-200-25 MG TABS tablet, Take 1 tablet by mouth daily., Disp: 30 tablet, Rfl: 2   omeprazole (PRILOSEC) 40 MG capsule, Take 40 mg by mouth daily., Disp: , Rfl:    traZODone (DESYREL) 50 MG tablet, Take 1 tablet (50 mg total) by mouth at bedtime as needed for sleep., Disp: 30 tablet, Rfl: 5    Review of Systems     Objective:   Physical Exam        Assessment & Plan:   HIV disease:  I will add order HIV viral load CD4 count CBC with differential CMP, RPR GC and chlamydia and I will continue  [employer],  prescription  Hyperlipidemia:

## 2023-03-15 ENCOUNTER — Other Ambulatory Visit: Payer: Self-pay

## 2023-03-15 ENCOUNTER — Ambulatory Visit (INDEPENDENT_AMBULATORY_CARE_PROVIDER_SITE_OTHER): Payer: Self-pay

## 2023-03-15 ENCOUNTER — Ambulatory Visit (INDEPENDENT_AMBULATORY_CARE_PROVIDER_SITE_OTHER): Payer: Self-pay | Admitting: Infectious Disease

## 2023-03-15 ENCOUNTER — Encounter: Payer: Self-pay | Admitting: Infectious Disease

## 2023-03-15 VITALS — BP 113/65 | HR 83 | Resp 16 | Ht 69.0 in | Wt 205.0 lb

## 2023-03-15 DIAGNOSIS — Z23 Encounter for immunization: Secondary | ICD-10-CM

## 2023-03-15 DIAGNOSIS — Z7185 Encounter for immunization safety counseling: Secondary | ICD-10-CM

## 2023-03-15 DIAGNOSIS — E785 Hyperlipidemia, unspecified: Secondary | ICD-10-CM

## 2023-03-15 DIAGNOSIS — G4700 Insomnia, unspecified: Secondary | ICD-10-CM

## 2023-03-15 DIAGNOSIS — B20 Human immunodeficiency virus [HIV] disease: Secondary | ICD-10-CM

## 2023-03-15 MED ORDER — ROSUVASTATIN CALCIUM 20 MG PO TABS: 20.0000 mg | ORAL_TABLET | Freq: Every day | ORAL | 11 refills | Status: DC

## 2023-03-15 MED ORDER — BIKTARVY 50-200-25 MG PO TABS: 1.0000 | ORAL_TABLET | Freq: Every day | ORAL | 2 refills | Status: DC

## 2023-03-15 MED ORDER — TRAZODONE HCL 50 MG PO TABS: 50.0000 mg | ORAL_TABLET | Freq: Every evening | ORAL | 5 refills | Status: DC | PRN

## 2023-03-15 NOTE — Addendum Note (Signed)
Addended by: Theresia Majors A on: 03/15/2023 04:31 PM   Modules accepted: Orders

## 2023-03-17 LAB — T-HELPER CELLS (CD4) COUNT (NOT AT ARMC)
CD4 % Helper T Cell: 44 % (ref 33–65)
CD4 T Cell Abs: 814 /uL (ref 400–1790)

## 2023-03-19 LAB — HIV-1 RNA ULTRAQUANT REFLEX TO GENTYP+
HIV 1 RNA Quant: 23 copies/mL — ABNORMAL HIGH
HIV-1 RNA Quant, Log: 1.36 Log copies/mL — ABNORMAL HIGH

## 2023-03-19 LAB — LIPID PANEL
Cholesterol: 186 mg/dL (ref <200)
HDL: 52 mg/dL (ref >=40)
LDL Cholesterol (Calc): 101 mg/dL (calc) — ABNORMAL HIGH
Non-HDL Cholesterol (Calc): 134 mg/dL (calc) — ABNORMAL HIGH (ref <130)
Total CHOL/HDL Ratio: 3.6 (calc) (ref <5.0)
Triglycerides: 210 mg/dL — ABNORMAL HIGH (ref <150)

## 2023-03-19 LAB — HEPATITIS B SURFACE ANTIBODY, QUANTITATIVE: Hep B S AB Quant (Post): <5 m[IU]/mL — ABNORMAL LOW (ref >=10)

## 2023-04-26 ENCOUNTER — Emergency Department
Admission: EM | Admit: 2023-04-26 | Discharge: 2023-04-26 | Discharge status: Against Medical Advice | Payer: Self-pay | Source: Home / Self Care

## 2023-05-30 ENCOUNTER — Emergency Department
Admission: EM | Admit: 2023-05-30 | Discharge: 2023-05-30 | Disposition: A | Discharge status: Home / Self Care | Payer: Self-pay | Attending: Emergency Medicine | Admitting: Emergency Medicine

## 2023-05-30 ENCOUNTER — Emergency Department: Payer: Self-pay

## 2023-05-30 ENCOUNTER — Other Ambulatory Visit: Payer: Self-pay

## 2023-05-30 DIAGNOSIS — Y92009 Unspecified place in unspecified non-institutional (private) residence as the place of occurrence of the external cause: Secondary | ICD-10-CM | POA: Insufficient documentation

## 2023-05-30 DIAGNOSIS — S60512A Abrasion of left hand, initial encounter: Secondary | ICD-10-CM

## 2023-05-30 DIAGNOSIS — Z21 Asymptomatic human immunodeficiency virus [HIV] infection status: Secondary | ICD-10-CM | POA: Insufficient documentation

## 2023-05-30 DIAGNOSIS — W25XXXA Contact with sharp glass, initial encounter: Secondary | ICD-10-CM | POA: Insufficient documentation

## 2023-05-30 DIAGNOSIS — S61412A Laceration without foreign body of left hand, initial encounter: Secondary | ICD-10-CM | POA: Insufficient documentation

## 2023-05-30 DIAGNOSIS — R030 Elevated blood-pressure reading, without diagnosis of hypertension: Secondary | ICD-10-CM | POA: Insufficient documentation

## 2023-05-30 MED ORDER — CEPHALEXIN 500 MG PO CAPS: 500.0000 mg | ORAL_CAPSULE | Freq: Four times a day (QID) | ORAL | 0 refills | Status: AC

## 2023-05-30 NOTE — ED Notes (Signed)
See triage note  Presents with laceration to left hand  States he was trying to open a window   his hand broke the window  Laceration noted to lateral left hand and 5 th finger

## 2023-05-30 NOTE — ED Provider Notes (Signed)
[facility] Provider Note    Event Date/Time   First MD Initiated Contact with Patient 05/30/23 0818     (approximate)   History   Laceration   HPI  Lance Reynolds is a 47 y.o. male presents to the ED with laceration to his left hand that occurred 3 days ago.  Patient reports that he cut it while trying to open a window and hit a piece of glass.  He reports his tetanus is up-to-date within the last 5 years.  Patient has a history of anxiety, depression, HIV+ and reflux.     Physical Exam   Triage Vital Signs: ED Triage Vitals  Enc Vitals Group     BP 05/30/23 0811 (!) 151/101     Pulse Rate 05/30/23 0811 71     Resp 05/30/23 0811 18     Temp 05/30/23 0811 97.9 F (36.6 C)     Temp src --      SpO2 05/30/23 0811 100 %     Weight 05/30/23 0812 205 lb (93 kg)     Height 05/30/23 0812 5\' 9"  (1.753 m)     Head Circumference --      Peak Flow --      Pain Score 05/30/23 0812 3     Pain Loc --      Pain Edu? --      Excl. in GC? --     Most recent vital signs: Vitals:   05/30/23 0811 05/30/23 0956  BP: (!) 151/101 124/84  Pulse: 71 70  Resp: 18 18  Temp: 97.9 F (36.6 C)   SpO2: 100% 100%     General: Awake, no distress.  CV:  Good peripheral perfusion.  Resp:  Normal effort.  Abd:  No distention.  Other:  Left hand dorsal aspect lateral fifth MP joint area with a a 1.5 cm laceration without active bleeding and no foreign body present.  Range of motion of the digits are within normal limits, capillary refills less than 3 seconds and motor or sensory function intact.  There is also a superficial abrasion noted to the palm of the left hand without evidence of infection.   ED Results / Procedures / Treatments   Labs (all labs ordered are listed, but only abnormal results are displayed) Labs Reviewed - No data to display    RADIOLOGY Left hand x-ray images were reviewed and interpreted by myself independent of the radiologist and  negative for fracture or opaque foreign body.    PROCEDURES:  Critical Care performed:   Marland KitchenMarland KitchenLaceration Repair  Date/Time: 05/30/2023 9:39 AM  Performed by: Tommi Rumps, PA-C Authorized by: Tommi Rumps, PA-C   Consent:    Consent obtained:  Verbal   Consent given by:  Patient   Risks discussed:  Pain, poor wound healing and infection Universal protocol:    Patient identity confirmed:  Verbally with patient Laceration details:    Location:  Hand   Hand location:  L hand, dorsum   Length (cm):  1.5 Pre-procedure details:    Preparation:  Imaging obtained to evaluate for foreign bodies Exploration:    Limited defect created (wound extended): no     Imaging outcome: foreign body not noted     Contaminated: no   Treatment:    Area cleansed with:  Saline   Amount of cleaning:  Standard Skin repair:    Repair method:  Tissue adhesive Approximation:    Approximation:  Loose Repair  type:    Repair type:  Simple Post-procedure details:    Procedure completion:  Tolerated    MEDICATIONS ORDERED IN ED: Medications - No data to display   IMPRESSION / MDM / ASSESSMENT AND PLAN / ED COURSE  I reviewed the triage vital signs and the nursing notes.   Differential diagnosis includes, but is not limited to, laceration left hand, abrasion, foreign body, fracture, infected wound.  47 year old male presents to the ED with laceration to his left hand that occurred 3 days ago.  No signs of infection at this time.  X-rays were negative for foreign body.  Patient hand was soaked and cleaned.  1 single clip was applied to the hand.  Dressing was applied to the abrasion.  Patient is instructed to watch for any signs of infection.  He was also placed on Keflex 500 mg to prevent infection.  He is to follow-up with his PCP if any continued problems.  His initial blood pressure was elevated however prior to discharge his blood pressure was 124/84.      Patient's presentation is  most consistent with acute complicated illness / injury requiring diagnostic workup.  FINAL CLINICAL IMPRESSION(S) / ED DIAGNOSES   Final diagnoses:  Laceration of left hand without foreign body, initial encounter  Abrasion of palm of left hand, initial encounter  Elevated blood pressure reading     Rx / DC Orders   ED Discharge Orders          Ordered    cephALEXin (KEFLEX) 500 MG capsule  4 times daily        05/30/23 0836             Note:  This document was prepared using Dragon voice recognition software and may include unintentional dictation errors.   Tommi Rumps, PA-C 05/30/23 1348    Chesley Noon, MD 05/30/23 1520

## 2023-05-30 NOTE — ED Triage Notes (Signed)
Pt states he was trying to open a stuck window at his house on Friday and cut his right hand, pt attempted to use butterfly bandages at home but states they are not working. Bleeding controlled, hand wrapped.

## 2023-05-30 NOTE — Discharge Instructions (Addendum)
Follow-up with your primary care provider to have your blood pressure rechecked.  Allow the area on your left hand to have lots of air to encourage healing.  A prescription for Keflex was sent to the pharmacy to help prevent any infection.  Keep the area clean and dry.

## 2023-06-28 ENCOUNTER — Encounter: Payer: Self-pay | Admitting: Emergency Medicine

## 2023-06-28 ENCOUNTER — Emergency Department: Payer: Self-pay

## 2023-06-28 ENCOUNTER — Emergency Department
Admission: EM | Admit: 2023-06-28 | Discharge: 2023-06-28 | Disposition: A | Discharge status: Home / Self Care | Payer: Self-pay | Attending: Emergency Medicine | Admitting: Emergency Medicine

## 2023-06-28 ENCOUNTER — Other Ambulatory Visit: Payer: Self-pay

## 2023-06-28 DIAGNOSIS — G8929 Other chronic pain: Secondary | ICD-10-CM | POA: Insufficient documentation

## 2023-06-28 DIAGNOSIS — M5441 Lumbago with sciatica, right side: Secondary | ICD-10-CM | POA: Insufficient documentation

## 2023-06-28 MED ORDER — PREDNISONE 10 MG PO TABS: ORAL_TABLET | ORAL | 0 refills | Status: DC

## 2023-06-28 NOTE — Discharge Instructions (Signed)
Call make an appointment with Dr. Allena Katz who is on-call for orthopedics if you continue to have problems with your back.  Begin taking the prednisone that was sent to your pharmacy today.  This begins with 6 tablets and tapers down by 1 tablet each day until you are finished.  You may use ice or heat to your back as needed for discomfort.  You may take Tylenol if additional pain medication is needed.

## 2023-06-28 NOTE — ED Triage Notes (Signed)
Pt comes with c/o right lower back pain and hip pain. Pt states he does lifting so not sure if he injured himself.

## 2023-06-28 NOTE — ED Notes (Signed)
See triage note Presents with lower back pain  States pain is mainly to right side and in hip  Ambulates slowly to room

## 2023-06-28 NOTE — ED Provider Notes (Signed)
[facility] Provider Note    Event Date/Time   First MD Initiated Contact with Patient 06/28/23 1004     (approximate)   History   Back Pain   HPI  Lance Reynolds is a 47 y.o. male   presents to the ED with complaint of low back pain with radiation over to his right hip without known history of injury.  Patient states that his back and right hip has been hurting for the last 2 to 3 months and he has been taking over-the-counter medication without any relief.      Physical Exam   Triage Vital Signs: ED Triage Vitals  Encounter Vitals Group     BP 06/28/23 0830 120/75     Systolic BP Percentile --      Diastolic BP Percentile --      Pulse Rate 06/28/23 0830 100     Resp 06/28/23 0830 19     Temp 06/28/23 0830 98.4 F (36.9 C)     Temp src --      SpO2 06/28/23 0830 100 %     Weight 06/28/23 0923 204 lb 12.9 oz (92.9 kg)     Height 06/28/23 0923 5\' 9"  (1.753 m)     Head Circumference --      Peak Flow --      Pain Score 06/28/23 0828 10     Pain Loc --      Pain Education --      Exclude from Growth Chart --     Most recent vital signs: Vitals:   06/28/23 0830 06/28/23 1216  BP: 120/75 120/70  Pulse: 100 99  Resp: 19 19  Temp: 98.4 F (36.9 C)   SpO2: 100% 100%     General: Awake, no distress.  Lying supine on the stretcher without any difficulty. CV:  Good peripheral perfusion.  Resp:  Normal effort.  Abd:  No distention.  Soft, nontender, bowel sounds normoactive x 4 quadrants. Other:  Lumbar spine with diffuse tenderness on palpation midline and paravertebral muscles bilaterally.  Right SI joint tenderness along with right hip tenderness on palpation.  Range of motion is without restriction.  Good muscle strength bilaterally at 5/5.  Patient is able to stand and ambulate without any assistance.   ED Results / Procedures / Treatments   Labs (all labs ordered are listed, but only abnormal results are displayed) Labs Reviewed  - No data to display    RADIOLOGY Right hip x-ray images were reviewed and interpreted by myself independent of the radiologist and was negative for fracture or dislocation. Lumbar spine x-ray images were reviewed and interpreted by myself independent of the radiologist and was noted to have multiple degenerative changes at multiple levels with anterior spurring and disc space narrowing.    PROCEDURES:  Critical Care performed:   Procedures   MEDICATIONS ORDERED IN ED: Medications - No data to display   IMPRESSION / MDM / ASSESSMENT AND PLAN / ED COURSE  I reviewed the triage vital signs and the nursing notes.   Differential diagnosis includes, but is not limited to, musculoskeletal pain, low back pain, low back strain, low back pain with right sciatica, compression fracture.  47 year old male presents to the ED with complaint of low back pain radiating to his right hip for the last 3 months without known history of injury.  Patient has been taking over-the-counter medication without any relief.  X-rays were reassuring and patient was made aware that  he has degenerative changes multiple levels of his back but no fractures.  We discussed going to a steroid taper dose at this time since he has taken over-the-counter medication without any relief.  He is agreeable with this.  He will follow-up with Dr. Allena Katz who is on-call for orthopedics if he continues to have problems.  He is also aware that the pain clinic is a consideration as well.      Patient's presentation is most consistent with acute illness / injury with system symptoms.  FINAL CLINICAL IMPRESSION(S) / ED DIAGNOSES   Final diagnoses:  Chronic bilateral low back pain with right-sided sciatica     Rx / DC Orders   ED Discharge Orders          Ordered    predniSONE (DELTASONE) 10 MG tablet        06/28/23 1209             Note:  This document was prepared using Dragon voice recognition software and may  include unintentional dictation errors.   Tommi Rumps, PA-C 06/28/23 1218    Trinna Post, MD 06/29/23 [phone]

## 2023-08-19 ENCOUNTER — Encounter: Payer: Self-pay | Admitting: Emergency Medicine

## 2023-08-19 ENCOUNTER — Emergency Department
Admission: EM | Admit: 2023-08-19 | Discharge: 2023-08-19 | Disposition: A | Discharge status: Home / Self Care | Payer: Self-pay | Attending: Emergency Medicine | Admitting: Emergency Medicine

## 2023-08-19 ENCOUNTER — Other Ambulatory Visit: Payer: Self-pay

## 2023-08-19 DIAGNOSIS — Z1152 Encounter for screening for COVID-19: Secondary | ICD-10-CM | POA: Insufficient documentation

## 2023-08-19 DIAGNOSIS — R197 Diarrhea, unspecified: Secondary | ICD-10-CM | POA: Insufficient documentation

## 2023-08-19 LAB — RESP PANEL BY RT-PCR (RSV, FLU A&B, COVID)  RVPGX2
Influenza A by PCR: NEGATIVE
Influenza B by PCR: NEGATIVE
Resp Syncytial Virus by PCR: NEGATIVE
SARS Coronavirus 2 by RT PCR: NEGATIVE

## 2023-08-19 NOTE — ED Triage Notes (Signed)
Patient to ED via POV for body aches, diarrhea, nausea, and not feeling well. Started Wednesday. Coworker has COVID.

## 2023-08-19 NOTE — ED Provider Notes (Signed)
[facility] Provider Note    Event Date/Time   First MD Initiated Contact with Patient 08/19/23 1005     (approximate)   History   Diarrhea   HPI Lance Reynolds is a 47 y.o. male presenting today for diarrhea.  Patient notes having generalized bodyaches, intermittent nausea, and diarrhea over the past couple days.  Reportedly has a coworker that has COVID.  He has been still able to eat and drink without significant issue.  Otherwise denies cough, congestion, shortness of breath, chest pain, abdominal pain.  Also reports eating heavy greasy food several days ago before symptoms started and thinks it may be related to that as well.     Physical Exam   Triage Vital Signs: ED Triage Vitals  Encounter Vitals Group     BP 08/19/23 0923 138/88     Systolic BP Percentile --      Diastolic BP Percentile --      Pulse Rate 08/19/23 0923 80     Resp 08/19/23 0923 18     Temp 08/19/23 0923 98.1 F (36.7 C)     Temp Source 08/19/23 0923 Oral     SpO2 08/19/23 0923 96 %     Weight 08/19/23 0920 212 lb (96.2 kg)     Height 08/19/23 0920 5\' 9"  (1.753 m)     Head Circumference --      Peak Flow --      Pain Score 08/19/23 0920 5     Pain Loc --      Pain Education --      Exclude from Growth Chart --     Most recent vital signs: Vitals:   08/19/23 0923  BP: 138/88  Pulse: 80  Resp: 18  Temp: 98.1 F (36.7 C)  SpO2: 96%   Physical Exam: I have reviewed the vital signs and nursing notes. General: Awake, alert, no acute distress.  Nontoxic appearing. Head:  Atraumatic, normocephalic.   ENT:  EOM intact, PERRL. Oral mucosa is pink and moist with no lesions. Neck: Neck is supple with full range of motion, No meningeal signs. Cardiovascular:  RRR, No murmurs. Peripheral pulses palpable and equal bilaterally. Respiratory:  Symmetrical chest wall expansion.  No rhonchi, rales, or wheezes.  Good air movement throughout.  No use of accessory muscles.    Musculoskeletal:  No cyanosis or edema. Moving extremities with full ROM Abdomen:  Soft, nontender, nondistended. Neuro:  GCS 15, moving all four extremities, interacting appropriately. Speech clear. Psych:  Calm, appropriate.   Skin:  Warm, dry, no rash.    ED Results / Procedures / Treatments   Labs (all labs ordered are listed, but only abnormal results are displayed) Labs Reviewed  RESP PANEL BY RT-PCR (RSV, FLU A&B, COVID)  [id number]     EKG    RADIOLOGY    PROCEDURES:  Critical Care performed: No  Procedures   MEDICATIONS ORDERED IN ED: Medications - No data to display   IMPRESSION / MDM / ASSESSMENT AND PLAN / ED COURSE  I reviewed the triage vital signs and the nursing notes.                              Differential diagnosis includes, but is not limited to, COVID, flu, RSV, viral gastroenteritis, constipation  Patient's presentation is most consistent with acute illness / injury with system symptoms.  Patient is a 47 year old male presenting today for  body aches and diarrhea.  Symptoms most consistent with a viral GI infection.  Vital signs otherwise stable and physical exam unremarkable.  Patient was negative for COVID, flu, and RSV.  Patient safe for discharge at this time and we discussed over-the-counter Imodium to take as necessary if diarrhea symptoms are continuing past tomorrow.  Patient agreeable with plan and given strict return precautions.  Told to follow-up with PCP as needed.  Clinical Course as of 08/19/23 1042  Thu Aug 19, 2023  1032 Resp panel by RT-PCR (RSV, Flu A&B, Covid) Anterior Nasal Swab All negative [DW]    Clinical Course User Index [DW] Janith Lima, MD     FINAL CLINICAL IMPRESSION(S) / ED DIAGNOSES   Final diagnoses:  Diarrhea of presumed infectious origin     Rx / DC Orders   ED Discharge Orders          Ordered    Ambulatory Referral to Primary Care (Establish Care)        08/19/23 1041              Note:  This document was prepared using Dragon voice recognition software and may include unintentional dictation errors.   Janith Lima, MD 08/19/23 [phone]

## 2023-08-19 NOTE — Discharge Instructions (Addendum)
I suspect today is likely related to a viral gastroenteritis but given your nausea and diarrhea.  Otherwise reassuring exam.  Continue hydration is much as you can at home.  You can use Tylenol for abdominal pain symptoms.  You can pick up Imodium over-the-counter and take 1 pill a day as needed if diarrhea symptoms are continuing significantly past tomorrow.  Please follow-up with your primary care provider as needed.

## 2023-08-23 NOTE — Group Note (Deleted)

## 2023-09-16 ENCOUNTER — Other Ambulatory Visit (HOSPITAL_COMMUNITY)
Admission: RE | Admit: 2023-09-16 | Discharge: 2023-09-16 | Disposition: A | Discharge status: Home / Self Care | Payer: Medicaid Other | Source: Ambulatory Visit | Attending: Infectious Disease | Admitting: Infectious Disease

## 2023-09-16 ENCOUNTER — Encounter: Payer: Self-pay | Admitting: Infectious Disease

## 2023-09-16 ENCOUNTER — Other Ambulatory Visit: Payer: Self-pay

## 2023-09-16 ENCOUNTER — Ambulatory Visit (INDEPENDENT_AMBULATORY_CARE_PROVIDER_SITE_OTHER): Payer: Self-pay | Admitting: Infectious Disease

## 2023-09-16 VITALS — BP 111/71 | HR 102 | Temp 98.0°F | Wt 205.0 lb

## 2023-09-16 DIAGNOSIS — B20 Human immunodeficiency virus [HIV] disease: Secondary | ICD-10-CM | POA: Insufficient documentation

## 2023-09-16 DIAGNOSIS — Z7185 Encounter for immunization safety counseling: Secondary | ICD-10-CM

## 2023-09-16 DIAGNOSIS — R197 Diarrhea, unspecified: Secondary | ICD-10-CM

## 2023-09-16 DIAGNOSIS — G4700 Insomnia, unspecified: Secondary | ICD-10-CM

## 2023-09-16 DIAGNOSIS — G8929 Other chronic pain: Secondary | ICD-10-CM

## 2023-09-16 DIAGNOSIS — Z23 Encounter for immunization: Secondary | ICD-10-CM

## 2023-09-16 DIAGNOSIS — E785 Hyperlipidemia, unspecified: Secondary | ICD-10-CM

## 2023-09-16 MED ORDER — BIKTARVY 50-200-25 MG PO TABS: 1.0000 | ORAL_TABLET | Freq: Every day | ORAL | 11 refills | Status: DC

## 2023-09-16 MED ORDER — ROSUVASTATIN CALCIUM 20 MG PO TABS: 20.0000 mg | ORAL_TABLET | Freq: Every day | ORAL | 11 refills | Status: DC

## 2023-09-16 NOTE — Progress Notes (Signed)
Subjective:  Chief complaint: follow-up for HIV disease on medications    Patient ID: Lance Reynolds, male    DOB: November 15, 1976, 47 y.o.   MRN: [id number]  HPI   Discussed the use of AI scribe software for clinical note transcription with the patient, who gave verbal consent to proceed.  History of Present Illness   Lance Reynolds, a 47 year old man living with HIV, is currently well controlled on Biktarvy. He had previously been on Genvoya and was taking it every other day to conserve medications due to running low. Since the last visit, he has been to the ER several times for various issues including lacerations, back pain, and diarrhea.  He recently experienced abdominal pain that wrapped around his stomach, initially causing concern for COVID-19 due to a coworker testing positive. However, his test was negative and the diarrhea has since resolved. The abdominal pain was diagnosed as arthritis. He manages the pain with heat and has not been taking any medications for it.  [name] also reports back pain, describing it as a numbing pain that radiates upwards. The pain is relieved by pressure applied to his back. He has not sought physical therapy for this issue.  He is currently receiving his medications, including rosuvastatin for cholesterol management, through mail from a pharmacy. He is enrolled in an assistance program for his medications.       Past Medical History:  Diagnosis Date   Anxiety    Depression    GERD (gastroesophageal reflux disease)    HIV disease (HCC) 03/14/2023   HIV positive (HCC)    Hyperlipidemia 11/02/2022   Immune deficiency disorder (HCC)    Insomnia 03/04/2022   Vaccine counseling 11/02/2022    No past surgical history on file.  No family history on file.    Social History   Socioeconomic History   Marital status: Single    Spouse name: Not on file   Number of children: Not on file   Years of education: Not on file   Highest education level: Not  on file  Occupational History   Not on file  Tobacco Use   Smoking status: Former    Types: Cigarettes    Passive exposure: Never   Smokeless tobacco: Current    Types: Chew  Vaping Use   Vaping status: Every Day  Substance and Sexual Activity   Alcohol use: Not Currently    Comment: rarely   Drug use: No   Sexual activity: Yes    Partners: Male    Comment: declined condoms  Other Topics Concern   Not on file  Social History Narrative   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   Social Determinants of Health   Financial Resource Strain: Not on file  Food Insecurity: Not on file  Transportation Needs: Not on file  Physical Activity: Not on file  Stress: Not on file  Social Connections: Not on file    Allergies  Allergen Reactions   Hydrocodone Nausea Only   Tramadol Nausea And Vomiting   Percocet [Oxycodone-Acetaminophen] Nausea And Vomiting     Current Outpatient Medications:    acetaminophen (TYLENOL) 325 MG tablet, Take by mouth., Disp: , Rfl:    bictegravir-emtricitabine-tenofovir AF (BIKTARVY) 50-200-25 MG TABS tablet, Take 1 tablet by mouth daily., Disp: 30 tablet, Rfl: 2   omeprazole (PRILOSEC) 40 MG capsule, Take 40 mg by mouth daily., Disp: , Rfl:    predniSONE (DELTASONE) 10 MG tablet, Take 6 tablets  today, on  day 2 take 5 tablets, day 3 take 4 tablets, day 4 take 3 tablets, day 5 take  2 tablets and 1 tablet the last day, Disp: 21 tablet, Rfl: 0   rosuvastatin (CRESTOR) 20 MG tablet, Take 1 tablet (20 mg total) by mouth daily., Disp: 30 tablet, Rfl: 11   traZODone (DESYREL) 50 MG tablet, Take 1 tablet (50 mg total) by mouth at bedtime as needed for sleep., Disp: 30 tablet, Rfl: 5    Review of Systems  Constitutional:  Negative for activity change, appetite change, chills, diaphoresis, fatigue, fever and unexpected weight change.  HENT:  Negative for congestion, rhinorrhea, sinus pressure, sneezing, sore throat and trouble swallowing.    Eyes:  Negative for photophobia and visual disturbance.  Respiratory:  Negative for cough, chest tightness, shortness of breath, wheezing and stridor.   Cardiovascular:  Negative for chest pain, palpitations and leg swelling.  Gastrointestinal:  Positive for diarrhea. Negative for abdominal distention, abdominal pain, anal bleeding, blood in stool, constipation, nausea and vomiting.  Genitourinary:  Negative for difficulty urinating, dysuria, flank pain and hematuria.  Musculoskeletal:  Positive for back pain. Negative for arthralgias, gait problem, joint swelling and myalgias.  Skin:  Negative for color change, pallor, rash and wound.  Neurological:  Negative for dizziness, tremors, weakness and light-headedness.  Hematological:  Negative for adenopathy. Does not bruise/bleed easily.  Psychiatric/Behavioral:  Negative for agitation, behavioral problems, confusion, decreased concentration, dysphoric mood and sleep disturbance.        Objective:   Physical Exam Constitutional:      Appearance: He is well-developed.  HENT:     Head: Normocephalic and atraumatic.  Eyes:     Conjunctiva/sclera: Conjunctivae normal.  Cardiovascular:     Rate and Rhythm: Normal rate and regular rhythm.  Pulmonary:     Effort: Pulmonary effort is normal. No respiratory distress.     Breath sounds: No wheezing.  Abdominal:     General: There is no distension.     Palpations: Abdomen is soft.  Musculoskeletal:        General: No tenderness. Normal range of motion.     Cervical back: Normal range of motion and neck supple.  Skin:    General: Skin is warm and dry.     Coloration: Skin is not pale.     Findings: No erythema or rash.  Neurological:     General: No focal deficit present.     Mental Status: He is alert and oriented to person, place, and time.  Psychiatric:        Mood and Affect: Mood normal.        Behavior: Behavior normal.        Thought Content: Thought content normal.         Judgment: Judgment normal.           Assessment & Plan:   Assessment and Plan    HIV Well controlled on Biktarvy. Recent ER visits for lacerations, back pain, and diarrhea. No recent symptoms of acute illness. -Continue Biktarvy. -Check HIV labs today.  Hyperlipidemia Over 57 years old and needs cholesterol management to reduce cardiovascular risk. Currently on rosuvastatin. -Continue rosuvastatin.  Back Pain Chronic back pain, possibly arthritis. Heat sometimes helps. No current use of analgesics or physical therapy. -Offered referral to physical therapy. Patient to consider.  Sexually Transmitted Infections No current symptoms. -Check for gonorrhea, chlamydia, and other STIs today.  Anal Pap Smear Previous anal Pap smear done in [place], date unknown. -Perform  anal Pap smear today.  Immunizations No recent flu or COVID vaccines. -Administer flu and COVID vaccines today.  Follow-up in 6 months.      HIV disease:  I will add order HIV viral load CD4 count CBC with differential CMP, RPR GC and chlamydia and I will continue  Eugenio Hoes [facility] prescription

## 2023-09-16 NOTE — Addendum Note (Signed)
Addended by: Juanita Laster on: 09/16/2023 03:44 PM   Modules accepted: Orders

## 2023-09-17 LAB — T-HELPER CELLS (CD4) COUNT (NOT AT ARMC)
CD4 % Helper T Cell: 49 % (ref 33–65)
CD4 T Cell Abs: 1055 /uL (ref 400–1790)

## 2023-09-20 LAB — CYTOLOGY, (ORAL, ANAL, URETHRAL) ANCILLARY ONLY
Chlamydia: NEGATIVE
Chlamydia: NEGATIVE
Comment: NEGATIVE
Comment: NORMAL
Comment: NEGATIVE
Comment: NORMAL
Neisseria Gonorrhea: NEGATIVE
Neisseria Gonorrhea: NEGATIVE

## 2023-09-20 LAB — CBC WITH DIFFERENTIAL/PLATELET
Absolute Monocytes: 470 {cells}/uL (ref 200–950)
Basophils Absolute: 67 {cells}/uL (ref 0–200)
Basophils Relative: 1.1 %
Eosinophils Absolute: 177 {cells}/uL (ref 15–500)
Eosinophils Relative: 2.9 %
HCT: 43.2 % (ref 38.5–50.0)
Hemoglobin: 14.6 g/dL (ref 13.2–17.1)
Lymphs Abs: 2434 {cells}/uL (ref 850–3900)
MCH: 32.4 pg (ref 27.0–33.0)
MCHC: 33.8 g/dL (ref 32.0–36.0)
MCV: 95.8 fL (ref 80.0–100.0)
MPV: 10.1 fL (ref 7.5–12.5)
Monocytes Relative: 7.7 %
Neutro Abs: 2952 {cells}/uL (ref 1500–7800)
Neutrophils Relative %: 48.4 %
Platelets: 318 10*3/uL (ref 140–400)
RBC: 4.51 10*6/uL (ref 4.20–5.80)
RDW: 12.8 % (ref 11.0–15.0)
Total Lymphocyte: 39.9 %
WBC: 6.1 10*3/uL (ref 3.8–10.8)

## 2023-09-20 LAB — LIPID PANEL
Cholesterol: 219 mg/dL — ABNORMAL HIGH (ref <200)
HDL: 51 mg/dL (ref >=40)
LDL Cholesterol (Calc): 129 mg/dL — ABNORMAL HIGH
Non-HDL Cholesterol (Calc): 168 mg/dL — ABNORMAL HIGH (ref <130)
Total CHOL/HDL Ratio: 4.3 (calc) (ref <5.0)
Triglycerides: 243 mg/dL — ABNORMAL HIGH (ref <150)

## 2023-09-20 LAB — COMPLETE METABOLIC PANEL WITH GFR
AG Ratio: 1.5 (calc) (ref 1.0–2.5)
ALT: 16 U/L (ref 9–46)
AST: 19 U/L (ref 10–40)
Albumin: 4.4 g/dL (ref 3.6–5.1)
Alkaline phosphatase (APISO): 69 U/L (ref 36–130)
BUN: 14 mg/dL (ref 7–25)
CO2: 26 mmol/L (ref 20–32)
Calcium: 9.8 mg/dL (ref 8.6–10.3)
Chloride: 106 mmol/L (ref 98–110)
Creat: 1.14 mg/dL (ref 0.60–1.29)
Globulin: 3 g/dL (ref 1.9–3.7)
Glucose, Bld: 80 mg/dL (ref 65–99)
Potassium: 4 mmol/L (ref 3.5–5.3)
Sodium: 140 mmol/L (ref 135–146)
Total Bilirubin: 1.4 mg/dL — ABNORMAL HIGH (ref 0.2–1.2)
Total Protein: 7.4 g/dL (ref 6.1–8.1)
eGFR: 79 mL/min/{1.73_m2} (ref >=60)

## 2023-09-20 LAB — HIV RNA, RTPCR W/R GT (RTI, PI,INT)
HIV 1 RNA Quant: <20 {copies}/mL — AB
HIV-1 RNA Quant, Log: <1.3 {Log} — AB

## 2023-09-20 LAB — URINE CYTOLOGY ANCILLARY ONLY
Chlamydia: NEGATIVE
Comment: NEGATIVE
Comment: NORMAL
Neisseria Gonorrhea: NEGATIVE

## 2023-09-20 LAB — T PALLIDUM AB: T Pallidum Abs: POSITIVE — AB

## 2023-09-20 LAB — RPR: RPR Ser Ql: REACTIVE — AB

## 2023-09-20 LAB — RPR TITER: RPR Titer: 1:4 {titer} — ABNORMAL HIGH

## 2023-09-23 LAB — CYTOLOGY - PAP
Adequacy: ABSENT
Comment: NEGATIVE
Diagnosis: UNDETERMINED — AB
High risk HPV: POSITIVE — AB

## 2023-09-28 ENCOUNTER — Telehealth: Payer: Self-pay

## 2023-09-28 ENCOUNTER — Other Ambulatory Visit: Payer: Self-pay | Admitting: Infectious Disease

## 2023-09-28 DIAGNOSIS — R85610 Atypical squamous cells of undetermined significance on cytologic smear of anus (ASC-US): Secondary | ICD-10-CM

## 2023-09-28 NOTE — Telephone Encounter (Signed)
Patient aware anal PAP showed ASCUS and referral has been made to [facility].    Jashan Cotten Lesli Albee, CMA

## 2023-09-28 NOTE — Telephone Encounter (Signed)
-----   Message from [place] sent at 09/28/2023  4:09 PM EDT ----- Regarding: ascus on anal pap referral to [facility] made  ----- Message ----- From: Janace Hoard Lab Results In Sent: 09/16/2023  10:55 PM EDT To: Randall Hiss, MD

## 2023-10-09 ENCOUNTER — Other Ambulatory Visit: Payer: Self-pay

## 2023-10-09 ENCOUNTER — Emergency Department: Payer: Worker's Compensation

## 2023-10-09 ENCOUNTER — Emergency Department
Admission: EM | Admit: 2023-10-09 | Discharge: 2023-10-09 | Disposition: A | Discharge status: Home / Self Care | Payer: Worker's Compensation | Attending: Emergency Medicine | Admitting: Emergency Medicine

## 2023-10-09 DIAGNOSIS — S161XXA Strain of muscle, fascia and tendon at neck level, initial encounter: Secondary | ICD-10-CM | POA: Diagnosis not present

## 2023-10-09 DIAGNOSIS — X501XXA Overexertion from prolonged static or awkward postures, initial encounter: Secondary | ICD-10-CM | POA: Insufficient documentation

## 2023-10-09 DIAGNOSIS — S199XXA Unspecified injury of neck, initial encounter: Secondary | ICD-10-CM | POA: Diagnosis present

## 2023-10-09 MED ORDER — METHOCARBAMOL 500 MG PO TABS: 500.0000 mg | ORAL_TABLET | Freq: Four times a day (QID) | ORAL | 0 refills | Status: DC

## 2023-10-09 MED ORDER — KETOROLAC TROMETHAMINE 30 MG/ML IJ SOLN
30.0000 mg | Freq: Once | INTRAMUSCULAR | Status: AC
  Administered 2023-10-09: 30 mg via INTRAMUSCULAR
  Filled 2023-10-09: qty 1

## 2023-10-09 MED ORDER — MELOXICAM 15 MG PO TABS: 15.0000 mg | ORAL_TABLET | Freq: Every day | ORAL | 0 refills | Status: DC

## 2023-10-09 NOTE — ED Triage Notes (Signed)
Pt sts that he was at work this past Wednesday and his neck was hyperflexed backwards. Pt sts that the pain has not stopped.

## 2023-10-09 NOTE — ED Notes (Signed)
Workers comp was done by [employer].

## 2023-10-09 NOTE — ED Provider Notes (Signed)
[facility] Provider Note  Patient Contact: 4:58 PM (approximate)   History   Neck Injury   HPI  Lance Reynolds is a 47 y.o. male who presents the emergency department for neck pain.  Patient states that he was working at his job, he works in a pit under large trucks, states he was exiting the pit when he hit his head on what he believes was 1 across members of the support.  Patient states that it forced his neck backwards and hyperextension injury.  He has had pain along the left side of the neck since.  No radicular symptoms.  No chest pain.  Patient states that while he did hit his head the majority of the energy transferred into his neck.     Physical Exam   Triage Vital Signs: ED Triage Vitals  Encounter Vitals Group     BP 10/09/23 1337 (!) 171/107     Systolic BP Percentile --      Diastolic BP Percentile --      Pulse Rate 10/09/23 1337 88     Resp 10/09/23 1337 17     Temp 10/09/23 1337 97.8 F (36.6 C)     Temp Source 10/09/23 1337 Oral     SpO2 10/09/23 1337 98 %     Weight 10/09/23 1338 225 lb (102.1 kg)     Height 10/09/23 1338 5\' 9"  (1.753 m)     Head Circumference --      Peak Flow --      Pain Score 10/09/23 1558 6     Pain Loc --      Pain Education --      Exclude from Growth Chart --     Most recent vital signs: Vitals:   10/09/23 1337 10/09/23 1707  BP: (!) 171/107 (!) 134/98  Pulse: 88 71  Resp: 17 16  Temp: 97.8 F (36.6 C) 98.1 F (36.7 C)  SpO2: 98% 98%     General: Alert and in no acute distress. Eyes:  PERRL. EOMI. Head: No acute traumatic findings  Neck: No stridor.  Midline left-sided cervical spine tenderness to palpation.  Patient has tenderness along the left paraspinal muscle group extending into the left trapezius muscle.  Appreciable spasms on the left when compared with right.  No palpable abnormality to the cervical spine itself.  No step-off.  Cardiovascular:  Good peripheral  perfusion Respiratory: Normal respiratory effort without tachypnea or retractions. Lungs CTAB.  Musculoskeletal: Full range of motion to all extremities.  Neurologic:  No gross focal neurologic deficits are appreciated.  Cranial nerves II through XII grossly intact. Skin:   No rash noted Other:   ED Results / Procedures / Treatments   Labs (all labs ordered are listed, but only abnormal results are displayed) Labs Reviewed - No data to display   EKG     RADIOLOGY  I personally viewed, evaluated, and interpreted these images as part of my medical decision making, as well as reviewing the written report by the radiologist.  ED Provider Interpretation: No acute traumatic finding to the cervical spine.  Straightening of the normal cervical lordosis consistent with muscle spasm.  CT Cervical Spine Wo Contrast  Result Date: 10/09/2023 CLINICAL DATA:  Neck trauma.  Decreased range of motion. EXAM: CT CERVICAL SPINE WITHOUT CONTRAST TECHNIQUE: Multidetector CT imaging of the cervical spine was performed without intravenous contrast. Multiplanar CT image reconstructions were also generated. RADIATION DOSE REDUCTION: This exam was performed according to  the departmental dose-optimization program which includes automated exposure control, adjustment of the mA and/or kV according to patient size and/or use of iterative reconstruction technique. COMPARISON:  None Available. FINDINGS: Alignment: No post traumatic malalignment of the cervical spine. Straightening of normal cervical lordosis may reflect muscle spasm or patient positioning. Skull base and vertebrae: No acute fracture. No primary bone lesion or focal pathologic process. Soft tissues and spinal canal: No prevertebral fluid or swelling. No visible canal hematoma. Disc levels: Mild disc space narrowing with endplate spurring identified at C3-4, C4-5 and C5-6. Upper chest: Negative. Other: None IMPRESSION: 1. No evidence for cervical spine  fracture or subluxation. 2. Straightening of normal cervical lordosis may reflect muscle spasm or patient positioning. 3. Mild cervical degenerative disc disease. Electronically Signed   By: Signa Kell M.D.   On: 10/09/2023 16:11    PROCEDURES:  Critical Care performed: No  Procedures   MEDICATIONS ORDERED IN ED: Medications  ketorolac (TORADOL) 30 MG/ML injection 30 mg (has no administration in time range)     IMPRESSION / MDM / ASSESSMENT AND PLAN / ED COURSE  I reviewed the triage vital signs and the nursing notes.                                 Differential diagnosis includes, but is not limited to, compression fracture, herniated disc, cervical radiculopathy, neck strain   Patient's presentation is most consistent with acute presentation with potential threat to life or bodily function.   Patient's diagnosis is consistent with cervical strain.  Patient presents to the emergency department complaining of left-sided neck pain after an injury at work.  Patient was walking, and hit his head which forced his neck backwards.  Patient denied any concerning symptoms for head or neurological symptoms, was complaining of neck pain.  Imaging of the neck revealed no acute traumatic osseous finding.  There is palpable spasms appreciated on physical exam and I suspect that this is cervical strain.  Patient will be placed on anti-inflammatory muscle relaxer.  Follow-up with orthopedics.. Patient is given ED precautions to return to the ED for any worsening or new symptoms.     FINAL CLINICAL IMPRESSION(S) / ED DIAGNOSES   Final diagnoses:  Strain of neck muscle, initial encounter     Rx / DC Orders   ED Discharge Orders          Ordered    meloxicam (MOBIC) 15 MG tablet  Daily        10/09/23 1715    methocarbamol (ROBAXIN) 500 MG tablet  4 times daily        10/09/23 1715             Note:  This document was prepared using Dragon voice recognition software and may  include unintentional dictation errors.   Racheal Patches, PA-C 10/09/23 1715    Minna Antis, MD 10/09/23 1921

## 2023-10-19 ENCOUNTER — Encounter: Payer: Self-pay | Admitting: Emergency Medicine

## 2023-10-19 ENCOUNTER — Other Ambulatory Visit: Payer: Self-pay

## 2023-10-19 ENCOUNTER — Emergency Department
Admission: EM | Admit: 2023-10-19 | Discharge: 2023-10-19 | Disposition: A | Discharge status: Home / Self Care | Payer: Medicaid Other | Attending: Emergency Medicine | Admitting: Emergency Medicine

## 2023-10-19 DIAGNOSIS — Z20822 Contact with and (suspected) exposure to covid-19: Secondary | ICD-10-CM | POA: Insufficient documentation

## 2023-10-19 DIAGNOSIS — J069 Acute upper respiratory infection, unspecified: Secondary | ICD-10-CM | POA: Insufficient documentation

## 2023-10-19 LAB — RESP PANEL BY RT-PCR (RSV, FLU A&B, COVID)  RVPGX2
Influenza A by PCR: NEGATIVE
Influenza B by PCR: NEGATIVE
Resp Syncytial Virus by PCR: NEGATIVE
SARS Coronavirus 2 by RT PCR: NEGATIVE

## 2023-10-19 MED ORDER — AZITHROMYCIN 250 MG PO TABS: ORAL_TABLET | ORAL | 0 refills | Status: AC

## 2023-10-19 NOTE — ED Triage Notes (Signed)
Patient to ED via POV for cough, headache, and generalized pain for a couple days. NAD noted in triage.

## 2023-10-19 NOTE — ED Provider Notes (Signed)
   [facility] Provider Note    Event Date/Time   First MD Initiated Contact with Patient 10/19/23 1013     (approximate)   History   Cough   HPI  Lance Reynolds is a 47 y.o. male who presents with complaints of cough, headache, sore throat over the last several days, no shortness of breath.     Physical Exam   Triage Vital Signs: ED Triage Vitals [10/19/23 0920]  Encounter Vitals Group     BP (!) 147/91     Systolic BP Percentile      Diastolic BP Percentile      Pulse Rate 86     Resp 18     Temp 98 F (36.7 C)     Temp Source Oral     SpO2 96 %     Weight 95.3 kg (210 lb)     Height 1.753 m (5\' 9" )     Head Circumference      Peak Flow      Pain Score 9     Pain Loc      Pain Education      Exclude from Growth Chart     Most recent vital signs: Vitals:   10/19/23 0920  BP: (!) 147/91  Pulse: 86  Resp: 18  Temp: 98 F (36.7 C)  SpO2: 96%     General: Awake, no distress.  CV:  Good peripheral perfusion.  Resp:  Normal effort.  Clear to auscultation bilaterally  Abd:  No distention.  Other:  Pharynx exam is normal   ED Results / Procedures / Treatments   Labs (all labs ordered are listed, but only abnormal results are displayed) Labs Reviewed  RESP PANEL BY RT-PCR (RSV, FLU A&B, COVID)  RVPGX2     EKG     RADIOLOGY     PROCEDURES:  Critical Care performed:   Procedures   MEDICATIONS ORDERED IN ED: Medications - No data to display   IMPRESSION / MDM / ASSESSMENT AND PLAN / ED COURSE  I reviewed the triage vital signs and the nursing notes. Patient's presentation is most consistent with acute complicated illness / injury requiring diagnostic workup.  Patient presents with symptoms as above consistent with upper respiratory infection, likely viral.  Overall reassuring exam, afebrile, COVID flu PCR is negative.  Recommend supportive care, outpatient follow-up with PCP.        FINAL CLINICAL  IMPRESSION(S) / ED DIAGNOSES   Final diagnoses:  Upper respiratory tract infection, unspecified type     Rx / DC Orders   ED Discharge Orders          Ordered    azithromycin (ZITHROMAX Z-PAK) 250 MG tablet        10/19/23 1018             Note:  This document was prepared using Dragon voice recognition software and may include unintentional dictation errors.   Jene Every, MD 10/19/23 [phone]

## 2023-11-26 ENCOUNTER — Telehealth: Payer: Self-pay

## 2023-11-26 MED ORDER — TRAZODONE HCL 50 MG PO TABS: 50.0000 mg | ORAL_TABLET | Freq: Every evening | ORAL | 2 refills | Status: DC | PRN

## 2023-11-26 NOTE — Addendum Note (Signed)
Addended by: Linna Hoff D on: 11/26/2023 12:28 PM   Modules accepted: Orders

## 2023-11-26 NOTE — Telephone Encounter (Signed)
Received refill request for trazodone 50mg  from [facility] in [place].   Sandie Ano, RN

## 2024-02-07 ENCOUNTER — Other Ambulatory Visit: Payer: Self-pay | Admitting: Infectious Disease

## 2024-02-07 NOTE — Telephone Encounter (Signed)
 Okay to refill?

## 2024-03-22 ENCOUNTER — Emergency Department: Payer: Self-pay

## 2024-03-22 ENCOUNTER — Emergency Department
Admission: EM | Admit: 2024-03-22 | Discharge: 2024-03-22 | Disposition: A | Discharge status: Home / Self Care | Payer: Self-pay | Attending: Emergency Medicine | Admitting: Emergency Medicine

## 2024-03-22 ENCOUNTER — Other Ambulatory Visit: Payer: Self-pay

## 2024-03-22 DIAGNOSIS — R112 Nausea with vomiting, unspecified: Secondary | ICD-10-CM

## 2024-03-22 DIAGNOSIS — K4020 Bilateral inguinal hernia, without obstruction or gangrene, not specified as recurrent: Secondary | ICD-10-CM | POA: Insufficient documentation

## 2024-03-22 DIAGNOSIS — R1084 Generalized abdominal pain: Secondary | ICD-10-CM

## 2024-03-22 DIAGNOSIS — Z21 Asymptomatic human immunodeficiency virus [HIV] infection status: Secondary | ICD-10-CM | POA: Insufficient documentation

## 2024-03-22 LAB — COMPREHENSIVE METABOLIC PANEL WITH GFR
ALT: 16 U/L (ref 0–44)
AST: 17 U/L (ref 15–41)
Albumin: 4.3 g/dL (ref 3.5–5.0)
Alkaline Phosphatase: 52 U/L (ref 38–126)
Anion gap: 9 (ref 5–15)
BUN: 10 mg/dL (ref 6–20)
CO2: 24 mmol/L (ref 22–32)
Calcium: 9.1 mg/dL (ref 8.9–10.3)
Chloride: 105 mmol/L (ref 98–111)
Creatinine, Ser: 1.08 mg/dL (ref 0.61–1.24)
GFR, Estimated: >60 mL/min (ref >60)
Glucose, Bld: 87 mg/dL (ref 70–99)
Potassium: 3.5 mmol/L (ref 3.5–5.1)
Sodium: 138 mmol/L (ref 135–145)
Total Bilirubin: 1.6 mg/dL — ABNORMAL HIGH (ref 0.0–1.2)
Total Protein: 7.6 g/dL (ref 6.5–8.1)

## 2024-03-22 LAB — CBC WITH DIFFERENTIAL/PLATELET
Abs Immature Granulocytes: 0.02 10*3/uL (ref 0.00–0.07)
Basophils Absolute: 0 10*3/uL (ref 0.0–0.1)
Basophils Relative: 0 %
Eosinophils Absolute: 0.2 10*3/uL (ref 0.0–0.5)
Eosinophils Relative: 2 %
HCT: 42.8 % (ref 39.0–52.0)
Hemoglobin: 14.5 g/dL (ref 13.0–17.0)
Immature Granulocytes: 0 %
Lymphocytes Relative: 27 %
Lymphs Abs: 2.6 10*3/uL (ref 0.7–4.0)
MCH: 32.7 pg (ref 26.0–34.0)
MCHC: 33.9 g/dL (ref 30.0–36.0)
MCV: 96.4 fL (ref 80.0–100.0)
Monocytes Absolute: 0.7 10*3/uL (ref 0.1–1.0)
Monocytes Relative: 7 %
Neutro Abs: 5.9 10*3/uL (ref 1.7–7.7)
Neutrophils Relative %: 64 %
Platelets: 310 10*3/uL (ref 150–400)
RBC: 4.44 MIL/uL (ref 4.22–5.81)
RDW: 13.1 % (ref 11.5–15.5)
WBC: 9.4 10*3/uL (ref 4.0–10.5)
nRBC: 0 % (ref 0.0–0.2)

## 2024-03-22 LAB — GASTROINTESTINAL PANEL BY PCR, STOOL (REPLACES STOOL CULTURE)

## 2024-03-22 LAB — C DIFFICILE QUICK SCREEN W PCR REFLEX
C Diff antigen: NEGATIVE
C Diff interpretation: NOT DETECTED
C Diff toxin: NEGATIVE

## 2024-03-22 LAB — RESP PANEL BY RT-PCR (RSV, FLU A&B, COVID)  RVPGX2
Influenza A by PCR: NEGATIVE
Influenza B by PCR: NEGATIVE
Resp Syncytial Virus by PCR: NEGATIVE
SARS Coronavirus 2 by RT PCR: NEGATIVE

## 2024-03-22 LAB — LIPASE, BLOOD: Lipase: 31 U/L (ref 11–51)

## 2024-03-22 MED ORDER — IOHEXOL 350 MG/ML SOLN
100.0000 mL | Freq: Once | INTRAVENOUS | Status: AC | PRN
  Administered 2024-03-22: 100 mL via INTRAVENOUS

## 2024-03-22 MED ORDER — ONDANSETRON 4 MG PO TBDP: 4.0000 mg | ORAL_TABLET | Freq: Three times a day (TID) | ORAL | 0 refills | Status: DC | PRN

## 2024-03-22 MED ORDER — LACTATED RINGERS IV BOLUS
1000.0000 mL | Freq: Once | INTRAVENOUS | Status: AC
  Administered 2024-03-22: 1000 mL via INTRAVENOUS

## 2024-03-22 MED ORDER — ONDANSETRON HCL 4 MG/2ML IJ SOLN
4.0000 mg | Freq: Once | INTRAMUSCULAR | Status: AC
  Administered 2024-03-22: 4 mg via INTRAVENOUS
  Filled 2024-03-22: qty 2

## 2024-03-22 NOTE — ED Notes (Addendum)
 Pt given crackers, peanut butter, and ginger ale for PO challenge per Dr. Jodie Echevaria

## 2024-03-22 NOTE — ED Provider Triage Note (Signed)
 Emergency Medicine Provider Triage Evaluation Note  Lance Reynolds , a 48 y.o. male  was evaluated in triage.  Pt complains of abdominal pain, nauseous, vomit.  Patient states started last night after having dinner.  Patient states he contacted work with same symptoms patient tolerates water and ginger al.  Review of Systems  Positive:  Negative:   Physical Exam  BP 125/88   Pulse 95   Temp 98.4 F (36.9 C) (Oral)   Resp 18   Ht 5\' 9"  (1.753 m)   Wt 99.8 kg   SpO2 95%   BMI 32.49 kg/m  Gen:   Awake, no distress   Resp:  Normal effort  MSK:   Moves extremities without difficulty  Other:    Medical Decision Making  Medically screening exam initiated at 3:27 PM.  Appropriate orders placed.  Lance Reynolds was informed that the remainder of the evaluation will be completed by another provider, this initial triage assessment does not replace that evaluation, and the importance of remaining in the ED until their evaluation is complete.  Patient with possible gastric viral gastroenteritis ordered CBC CMP   Gladys Damme, PA-C 03/22/24 1528

## 2024-03-22 NOTE — ED Triage Notes (Signed)
 Pt reports onset of generalized abdominal pain, nausea and diarrhea 6 hours after eating dinner. Pt says there was a coworker at work with the stomach bug. Pt able to tolerate fluids, has gingerale in triage.

## 2024-03-22 NOTE — ED Notes (Signed)
 Pt gone to CT

## 2024-03-22 NOTE — ED Provider Notes (Signed)
 [facility] Provider Note    Event Date/Time   First MD Initiated Contact with Patient 03/22/24 1730     (approximate)   History   Diarrhea, Nausea, and Abdominal Pain   HPI  Lance Reynolds is a 48 y.o. male with history of HIV, anxiety, depression, GERD, presenting with nausea, vomiting, diarrhea, abdominal pain since yesterday.  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  Is tolerating p.o.  No fevers.  States that he has been compliant with his medications.  No chest pain or shortness of breath, no urinary symptoms.  No blood in his diarrhea.  On independent chart review, he was last seen by his infectious disease doctor in October of last year, he is on Biktarvy for his HIV and is well-controlled.  Last CD4 count was 1055.  HIV load was undetectable.     Physical Exam   Triage Vital Signs: ED Triage Vitals  Encounter Vitals Group     BP 03/22/24 1525 125/88     Systolic BP Percentile --      Diastolic BP Percentile --      Pulse Rate 03/22/24 1525 95     Resp 03/22/24 1525 18     Temp 03/22/24 1524 98.4 F (36.9 C)     Temp Source 03/22/24 1524 Oral     SpO2 03/22/24 1525 95 %     Weight 03/22/24 1524 220 lb (99.8 kg)     Height 03/22/24 1524 5\' 9"  (1.753 m)     Head Circumference --      Peak Flow --      Pain Score 03/22/24 1524 7     Pain Loc --      Pain Education --      Exclude from Growth Chart --     Most recent vital signs: Vitals:   03/22/24 1525 03/22/24 1949  BP: 125/88 126/81  Pulse: 95 85  Resp: 18 18  Temp:  98.6 F (37 C)  SpO2: 95% 95%     General: Awake, no distress.  CV:  Good peripheral perfusion.  Resp:  Normal effort.  Abd:  No distention.  Mild diffuse tenderness without guarding Other:  Mildly dry on exam.   ED Results / Procedures / Treatments   Labs (all labs ordered are listed, but only abnormal results are displayed) Labs Reviewed   COMPREHENSIVE METABOLIC PANEL WITH GFR - Abnormal; Notable for the following components:      Result Value   Total Bilirubin 1.6 (*)    All other components within normal limits  RESP PANEL BY RT-PCR (RSV, FLU A&B, COVID)  RVPGX2  GASTROINTESTINAL PANEL BY PCR, STOOL (REPLACES STOOL CULTURE)  C DIFFICILE QUICK SCREEN W PCR REFLEX    CBC WITH DIFFERENTIAL/PLATELET  LIPASE, BLOOD  T-HELPER CELLS CD4/CD8 %     RADIOLOGY CT imaging on my independent interpretation without obvious free air   PROCEDURES:  Critical Care performed: No  Procedures   MEDICATIONS ORDERED IN ED: Medications  lactated ringers bolus 1,000 mL (0 mLs Intravenous Stopped 03/22/24 2126)  ondansetron (ZOFRAN) injection 4 mg (4 mg Intravenous Given 03/22/24 1920)  iohexol (OMNIPAQUE) 350 MG/ML injection 100 mL (100 mLs Intravenous Contrast Given 03/22/24 1834)     IMPRESSION / MDM / ASSESSMENT AND PLAN / ED COURSE  I reviewed the triage vital signs and the nursing notes.  Differential diagnosis includes, but is not limited to, gastroenteritis, viral illness, colitis, diverticulitis, electrolyte derangements, dehydration.  Will get labs, IV fluids, GI PCR, C. difficile panel.  CT abdomen pelvis.  Reassess.  Patient's presentation is most consistent with acute presentation with potential threat to life or bodily function.  Independent review of last imaging are below.  On reassessment patient is tolerating p.o., abdomen soft nontender.  Considered but no indication for inpatient admission at this time, he is safe for outpatient management.  Discussed labs as well as imaging results including incidental findings with patient, will give him a call for surgery to follow-up for his inguinal hernias.  Shared decision making done with patient and he is agreeable plan for discharge, encouraged hydration, will give him a short prescription for Zofran for nausea vomiting when at home.  Instructed  him to follow-up with his primary care doctor in 2 to 3 days to get reassessed.  Strict precautions given.  Discharge.  Clinical Course as of 03/22/24 2202  Wed Mar 22, 2024  2054 Independent review of labs, GI panel is negative, C. difficile panel is negative, respiratory viral panel is negative, lipase is normal, electrolytes not severely deranged, creatinine is normal, total bilirubin is mildly elevated but he has no jaundice or right upper quadrant abdominal pain, T. bili has been elevated in the past, no leukocytosis. [TT]  2108 CT ABDOMEN PELVIS W CONTRAST IMPRESSION: 1. No CT evidence for acute intra-abdominal or pelvic abnormality. 2. Bilateral fat containing inguinal hernias. Small fat containing umbilical hernia.   [TT]    Clinical Course User Index [TT] Claybon Jabs, MD     FINAL CLINICAL IMPRESSION(S) / ED DIAGNOSES   Final diagnoses:  Nausea vomiting and diarrhea  Generalized abdominal pain  Bilateral inguinal hernia without obstruction or gangrene, recurrence not specified     Rx / DC Orders   ED Discharge Orders          Ordered    Ambulatory Referral to Primary Care (Establish Care)        03/22/24 2108    ondansetron (ZOFRAN-ODT) 4 MG disintegrating tablet  Every 8 hours PRN        03/22/24 2200             Note:  This document was prepared using Dragon voice recognition software and may include unintentional dictation errors.    Claybon Jabs, MD 03/22/24 [phone]

## 2024-03-24 LAB — T-HELPER CELLS CD4/CD8 %
% CD 4 Pos. Lymph.: 43 % (ref 30.8–58.5)
Absolute CD 4 Helper: 1075 /uL (ref 359–1519)
Basophils Absolute: 0 10*3/uL (ref 0.0–0.2)
Basos: 1 %
CD3+CD4+ Cells/CD3+CD8+ Cells Bld: 1.08 (ref 0.92–3.72)
CD3+CD8+ Cells # Bld: 995 /uL — ABNORMAL HIGH (ref 109–897)
CD3+CD8+ Cells NFr Bld: 39.8 % — ABNORMAL HIGH (ref 12.0–35.5)
EOS (ABSOLUTE): 0.2 10*3/uL (ref 0.0–0.4)
Eos: 2 %
Hematocrit: 41 % (ref 37.5–51.0)
Hemoglobin: 13.8 g/dL (ref 13.0–17.7)
Immature Grans (Abs): 0 10*3/uL (ref 0.0–0.1)
Immature Granulocytes: 0 %
Lymphocytes Absolute: 2.5 10*3/uL (ref 0.7–3.1)
Lymphs: 30 %
MCH: 32.9 pg (ref 26.6–33.0)
MCHC: 33.7 g/dL (ref 31.5–35.7)
MCV: 98 fL — ABNORMAL HIGH (ref 79–97)
Monocytes Absolute: 0.6 10*3/uL (ref 0.1–0.9)
Monocytes: 7 %
Neutrophils Absolute: 5.1 10*3/uL (ref 1.4–7.0)
Neutrophils: 60 %
Platelets: 316 10*3/uL (ref 150–450)
RBC: 4.2 x10E6/uL (ref 4.14–5.80)
RDW: 12.8 % (ref 11.6–15.4)
WBC: 8.5 10*3/uL (ref 3.4–10.8)

## 2024-04-01 NOTE — Progress Notes (Unsigned)
 Chief complaint: follow-up for HIV disease on medications   Subjective:    Patient ID: Lance Reynolds, male    DOB: 05/08/1976, 48 y.o.   MRN: [id number]  HPI  Discussed the use of AI scribe software for clinical note transcription with the patient, who gave verbal consent to proceed.  History of Present Illness   The patient, with a history of HIV and hyperlipidemia, presents after a recent ER visit for abdominal pain. He reports that a CT scan revealed bilateral hernias, which he was unaware of until contacted by a specialist's office. He has an upcoming appointment with a surgeon to discuss potential intervention.   The patient is currently on Biktarvy  for HIV management, but admits to missing several doses in the past month due to forgetfulness. He also reports occasionally taking double doses when he forgets if he's taken his medication. He is also on rosuvastatin  for hyperlipidemia.        Past Medical History:  Diagnosis Date   Anxiety    Depression    GERD (gastroesophageal reflux disease)    HIV disease (HCC) 03/14/2023   HIV positive (HCC)    Hyperlipidemia 11/02/2022   Immune deficiency disorder (HCC)    Insomnia 03/04/2022   Vaccine counseling 11/02/2022    No past surgical history on file.  No family history on file.    Social History   Socioeconomic History   Marital status: Divorced    Spouse name: Not on file   Number of children: Not on file   Years of education: Not on file   Highest education level: Not on file  Occupational History   Not on file  Tobacco Use   Smoking status: Former    Types: Cigarettes    Passive exposure: Never   Smokeless tobacco: Former    Types: Engineer, drilling   Vaping status: Every Day  Substance and Sexual Activity   Alcohol use: Not Currently    Comment: rarely   Drug use: No   Sexual activity: Yes    Partners: Male    Comment: declined condoms  Other Topics Concern   Not on file  Social History  Narrative   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   Social Drivers of Corporate investment banker Strain: Not on file  Food Insecurity: Not on file  Transportation Needs: Not on file  Physical Activity: Not on file  Stress: Not on file  Social Connections: Not on file    Allergies  Allergen Reactions   Hydrocodone  Nausea Only   Tramadol Nausea And Vomiting   Percocet [Oxycodone -Acetaminophen ] Nausea And Vomiting     Current Outpatient Medications:    acetaminophen  (TYLENOL ) 325 MG tablet, Take by mouth. (Patient not taking: Reported on 09/16/2023), Disp: , Rfl:    bictegravir-emtricitabine-tenofovir AF (BIKTARVY ) 50-200-25 MG TABS tablet, Take 1 tablet by mouth daily., Disp: 30 tablet, Rfl: 11   meloxicam  (MOBIC ) 15 MG tablet, Take 1 tablet (15 mg total) by mouth daily., Disp: 30 tablet, Rfl: 0   methocarbamol  (ROBAXIN ) 500 MG tablet, Take 1 tablet (500 mg total) by mouth 4 (four) times daily., Disp: 16 tablet, Rfl: 0   omeprazole (PRILOSEC) 40 MG capsule, Take 40 mg by mouth daily. (Patient not taking: Reported on 09/16/2023), Disp: , Rfl:    ondansetron  (ZOFRAN -ODT) 4 MG disintegrating tablet, Take 1 tablet (4 mg total) by mouth every 8 (eight) hours as needed for nausea or vomiting., Disp: 20 tablet,  Rfl: 0   predniSONE  (DELTASONE ) 10 MG tablet, Take 6 tablets  today, on day 2 take 5 tablets, day 3 take 4 tablets, day 4 take 3 tablets, day 5 take  2 tablets and 1 tablet the last day (Patient not taking: Reported on 09/16/2023), Disp: 21 tablet, Rfl: 0   rosuvastatin  (CRESTOR ) 20 MG tablet, Take 1 tablet (20 mg total) by mouth daily., Disp: 30 tablet, Rfl: 11   traZODone  (DESYREL ) 50 MG tablet, TAKE 1 TABLET(50 MG) BY MOUTH AT BEDTIME AS NEEDED FOR SLEEP, Disp: 30 tablet, Rfl: 2    Review of Systems  Constitutional:  Negative for activity change, appetite change, chills, diaphoresis, fatigue, fever and unexpected weight change.  HENT:  Negative for congestion,  rhinorrhea, sinus pressure, sneezing, sore throat and trouble swallowing.   Eyes:  Negative for photophobia and visual disturbance.  Respiratory:  Negative for cough, chest tightness, shortness of breath, wheezing and stridor.   Cardiovascular:  Negative for chest pain, palpitations and leg swelling.  Gastrointestinal:  Positive for abdominal pain. Negative for abdominal distention, anal bleeding, blood in stool, constipation, diarrhea, nausea and vomiting.  Genitourinary:  Negative for difficulty urinating, dysuria, flank pain and hematuria.  Musculoskeletal:  Negative for arthralgias, back pain, gait problem, joint swelling and myalgias.  Skin:  Negative for color change, pallor, rash and wound.  Neurological:  Negative for dizziness, tremors, weakness and light-headedness.  Hematological:  Negative for adenopathy. Does not bruise/bleed easily.  Psychiatric/Behavioral:  Negative for agitation, behavioral problems, confusion, decreased concentration, dysphoric mood and sleep disturbance.        Objective:   Physical Exam Constitutional:      Appearance: He is well-developed.  HENT:     Head: Normocephalic and atraumatic.  Eyes:     Conjunctiva/sclera: Conjunctivae normal.  Cardiovascular:     Rate and Rhythm: Normal rate and regular rhythm.  Pulmonary:     Effort: Pulmonary effort is normal. No respiratory distress.     Breath sounds: No wheezing.  Abdominal:     General: There is no distension.     Palpations: Abdomen is soft.  Musculoskeletal:        General: No tenderness. Normal range of motion.     Cervical back: Normal range of motion and neck supple.  Skin:    General: Skin is warm and dry.     Coloration: Skin is not pale.     Findings: No erythema or rash.  Neurological:     General: No focal deficit present.     Mental Status: He is alert and oriented to person, place, and time.  Psychiatric:        Mood and Affect: Mood normal.        Behavior: Behavior normal.         Thought Content: Thought content normal.        Judgment: Judgment normal.           Assessment & Plan:   Assessment and Plan    HIV infection On Biktarvy  with recent non-adherence. Emphasized importance of consistent dosing to prevent resistance. Declined long-acting injectables due to failure rate concerns. - Check labs to monitor HIV status. - Encourage adherence to Biktarvy  with a timer or routine.  Hernia Bilateral fat-containing hernias identified on CT. Awaiting specialist evaluation for potential surgery. - Attend specialist appointment on Wednesday for hernia evaluation and management.  Hyperlipidemia  continue rosuvastatin  for cholesterol and inflammation.  Sexually Transmitted Infections (STIs) screening Agreed to comprehensive STI  screening. - Perform STI screening with oral swab, rectal swab, and urine tests.  Vaccination status Vaccination status uncertain. Records need review. - Check vaccination records to determine if any updates are needed. --tetanus vaccine given  Follow-up Follow-up timing discussed based on labs and routine care. - Schedule follow-up appointment in July, approximately four months from now.

## 2024-04-03 ENCOUNTER — Encounter: Payer: Self-pay | Admitting: Infectious Disease

## 2024-04-03 ENCOUNTER — Ambulatory Visit (INDEPENDENT_AMBULATORY_CARE_PROVIDER_SITE_OTHER): Payer: Self-pay | Admitting: Infectious Disease

## 2024-04-03 ENCOUNTER — Other Ambulatory Visit: Payer: Self-pay

## 2024-04-03 VITALS — BP 121/83 | HR 90 | Resp 16 | Ht 69.0 in | Wt 213.0 lb

## 2024-04-03 DIAGNOSIS — Z23 Encounter for immunization: Secondary | ICD-10-CM

## 2024-04-03 DIAGNOSIS — B20 Human immunodeficiency virus [HIV] disease: Secondary | ICD-10-CM

## 2024-04-03 DIAGNOSIS — E785 Hyperlipidemia, unspecified: Secondary | ICD-10-CM

## 2024-04-03 DIAGNOSIS — Z7185 Encounter for immunization safety counseling: Secondary | ICD-10-CM

## 2024-04-03 DIAGNOSIS — K469 Unspecified abdominal hernia without obstruction or gangrene: Secondary | ICD-10-CM

## 2024-04-03 DIAGNOSIS — K439 Ventral hernia without obstruction or gangrene: Secondary | ICD-10-CM

## 2024-04-03 DIAGNOSIS — Z113 Encounter for screening for infections with a predominantly sexual mode of transmission: Secondary | ICD-10-CM

## 2024-04-03 NOTE — Addendum Note (Signed)
 Addended by: Nikitha Mode A on: 04/03/2024 08:56 AM   Modules accepted: Orders

## 2024-04-04 LAB — CT/NG RNA, TMA RECTAL
Chlamydia Trachomatis RNA: NOT DETECTED
Neisseria Gonorrhoeae RNA: NOT DETECTED

## 2024-04-04 LAB — GC/CHLAMYDIA PROBE, AMP (THROAT)
Chlamydia trachomatis RNA: NOT DETECTED
Neisseria gonorrhoeae RNA: NOT DETECTED

## 2024-04-04 LAB — C. TRACHOMATIS/N. GONORRHOEAE RNA
C. trachomatis RNA, TMA: NOT DETECTED
N. gonorrhoeae RNA, TMA: NOT DETECTED

## 2024-04-05 ENCOUNTER — Ambulatory Visit: Payer: Self-pay | Admitting: Surgery

## 2024-04-05 ENCOUNTER — Encounter: Payer: Self-pay | Admitting: Surgery

## 2024-04-05 VITALS — BP 128/85 | HR 98 | Temp 98.7°F | Ht 69.0 in | Wt 211.6 lb

## 2024-04-05 DIAGNOSIS — K429 Umbilical hernia without obstruction or gangrene: Secondary | ICD-10-CM

## 2024-04-05 DIAGNOSIS — K4020 Bilateral inguinal hernia, without obstruction or gangrene, not specified as recurrent: Secondary | ICD-10-CM

## 2024-04-05 NOTE — Progress Notes (Unsigned)
 04/05/2024  Reason for Visit:  Bilateral inguinal hernias, umbilical hernia  History of Present Illness: Lance Reynolds is a 48 y.o. male presenting for evaluation of bilateral inguinal hernias and umbilical hernia.  The patient presented to the ED on 03/22/24 with nausea, vomiting, diarrhea, and abdominal pain.  A coworker had been sick recently with similar symptoms and this was felt to be gastroenteritis.  He had a CT scan of abdomen and pelvis which did not show any acute pathology, but did show bilateral fat containing inguinal hernias and a fat containing umbilical hernia.  The patient reports that he's noticed a swelling and bulging in the right groin, but not so much on the left side.  He denies any issues at the umbilicus.  Overall reports that he feels the hernia is there in the right groin but denies any worsening pain or to significant discomfort that would prevent him from doing his usual daily activities.  Past Medical History: Past Medical History:  Diagnosis Date   Anxiety    Depression    GERD (gastroesophageal reflux disease)    HIV disease (HCC) 03/14/2023   HIV positive (HCC)    Hyperlipidemia 11/02/2022   Immune deficiency disorder (HCC)    Insomnia 03/04/2022   Vaccine counseling 11/02/2022     Past Surgical History: History reviewed. No pertinent surgical history.  Home Medications: Prior to Admission medications   Medication Sig Start Date End Date Taking? Authorizing Provider  bictegravir-emtricitabine-tenofovir AF (BIKTARVY ) 50-200-25 MG TABS tablet Take 1 tablet by mouth daily. 09/16/23  Yes Ernie Heal, Jerelyn Money, MD  rosuvastatin  (CRESTOR ) 20 MG tablet Take 1 tablet (20 mg total) by mouth daily. 09/16/23  Yes Ernie Heal, Jerelyn Money, MD  traZODone  (DESYREL ) 50 MG tablet TAKE 1 TABLET(50 MG) BY MOUTH AT BEDTIME AS NEEDED FOR SLEEP 02/08/24  Yes Ernie Heal, Jerelyn Money, MD    Allergies: Allergies  Allergen Reactions   Hydrocodone  Nausea Only   Tramadol Nausea And  Vomiting   Percocet [Oxycodone -Acetaminophen ] Nausea And Vomiting    Social History:  reports that he has quit smoking. His smoking use included cigarettes. He has never been exposed to tobacco smoke. He has quit using smokeless tobacco.  His smokeless tobacco use included chew. He reports that he does not currently use alcohol. He reports that he does not use drugs.   Family History: History reviewed. No pertinent family history.  Review of Systems: Review of Systems  Constitutional:  Negative for chills and fever.  Respiratory:  Negative for shortness of breath.   Cardiovascular:  Negative for chest pain.  Gastrointestinal:  Negative for abdominal pain, nausea and vomiting.    Physical Exam BP 128/85   Pulse 98   Temp 98.7 F (37.1 C) (Oral)   Ht 5\' 9"  (1.753 m)   Wt 211 lb 9.6 oz (96 kg)   SpO2 98%   BMI 31.25 kg/m  CONSTITUTIONAL: No acute distress HEENT:  Normocephalic, atraumatic, extraocular motion intact. RESPIRATORY:  Lungs are clear, and breath sounds are equal bilaterally. Normal respiratory effort without pathologic use of accessory muscles. CARDIOVASCULAR: Heart is regular without murmurs, gallops, or rubs. GI: The abdomen is soft, nondistended, nontender to palpation.  The patient has reducible bilateral inguinal hernias with the right side being larger than the left.  However both are easily reducible without any discomfort.  He does have a reducible umbilical hernia as well with some mild discomfort when pushing deeply in the area but no significant pain and no  concerns with incarceration or strangulation.  MUSCULOSKELETAL:  Normal muscle strength and tone in all four extremities.  No peripheral edema or cyanosis. SKIN: Skin turgor is normal. There are no pathologic skin lesions.  NEUROLOGIC:  Motor and sensation is grossly normal.  Cranial nerves are grossly intact. PSYCH:  Alert and oriented to person, place and time. Affect is normal.  Laboratory  Analysis: Labs from 04/03/2024: Sodium 141, potassium 4.4, chloride 108, CO2 27, BUN 13, creatinine 1.17.  LFTs within normal limits.  WBC 5.9, hemoglobin 14, hematocrit 41.7, platelets 319.  Imaging: CT scan abdomen/pelvis on 03/22/2024: IMPRESSION: 1. No CT evidence for acute intra-abdominal or pelvic abnormality. 2. Bilateral fat containing inguinal hernias. Small fat containing umbilical hernia.   Assessment and Plan: This is a 48 y.o. male with bilateral inguinal hernias and umbilical hernia.  - Discussed with the patient the findings on his CT scan and also on exam today.  Overall he does have reducible bilateral inguinal hernias and a reducible umbilical hernia although with some symptoms at the umbilicus due to the pushing.  Discussed with the patient that once hernias occur, the natural progression of hernias is to enlarge with size although potentially slowly and also potentially start causing symptoms in the future.  At this point the patient remains overall minimally symptomatic if at all and we do have flexibility in timing for any surgery. -The patient does not have insurance at this point and given that he is minimally symptomatic, I do not think we have to rush with any surgery at this point.  However I did encourage him to complete the application process for [place] financial assistance.  That way in the future if we do think about surgery this could potentially be covered so he does not have any surprise bills.  The patient is in agreement with this. - The patient will complete the application process and he will reach out to us  once that is completed and he is approved.  In the meantime, discussed with him trying to decrease some of the stretching activities to decrease any aggravation to the hernias. - Return precautions given.  All of his questions have been answered.  I spent 30 minutes dedicated to the care of this patient on the date of this encounter to include pre-visit  review of records, face-to-face time with the patient discussing diagnosis and management, and any post-visit coordination of care.   Marene Shape, MD [facility]

## 2024-04-05 NOTE — Patient Instructions (Signed)
 Groin Hernia (Inguinal Hernia) in Adults: What to Know  A hernia happens when an organ or tissue in your body pushes out through a weak spot in the muscles of your belly. This makes a bulge. A groin hernia is also called an inguinal hernia. It's found in your groin, which is the area where your leg meets your lower belly. This kind of hernia could also be: In your scrotum, if you're male. In the folds of skin around your vagina, if you're male. You may be able to push the bulge back into your belly. If you can't push it in and blood flow is cut off to the hernia, you'll need surgery right away. What are the causes? A groin hernia may happen when you strain your belly muscles, such as when you: Lift a heavy object. Strain to poop. Cough. What increases the risk? You may be more likely to get a groin hernia if: You're male. You're 50 years or older. You're pregnant. You've had a groin hernia or belly surgery before. You smoke. You're overweight. You work at a job where you need to stand a lot or lift heavy things. What are the signs or symptoms? Symptoms may depend on how big the hernia is. If it's small, you may not have symptoms. If it's bigger, you may have: A bulge near your groin or genitals. Pain or burning in your groin. A dull ache or feeling of pressure in your groin. If blood flow is cut off to the tissues inside the hernia, you may also: Feel pain and tenderness when you touch the bulge. The skin over it may turn red or purple. Have a fever. Throw up or feel like you may throw up. Have trouble pooping or passing gas. How is this treated? Treatment depends on how big the hernia is and what symptoms you have. You may need: To be watched to see if the bulge grows bigger. Surgery. This may be done if the hernia is big or if you have symptoms. Follow these instructions at home: Lifestyle Ask if it's OK for you to lift. Try not to stand for long periods of time. Do not  smoke, vape, or use nicotine or tobacco. Stay at a healthy weight. Try not to do things that put pressure on your hernia. Preventing trouble pooping You may need to take these steps to help prevent or treat trouble pooping (constipation): Take medicines to help you poop. Eat foods high in fiber, like beans, whole grains, and fresh fruits and vegetables. Drink more fluids as told. General instructions Try to push the hernia back in place by very gently pressing on it while lying down. Do not try to force it back in if it won't push in easily. Watch your hernia for any changes in: Shape. Size. Color. Take your medicines only as told. Contact a doctor if: You have a fever. You have new symptoms. Your symptoms get worse. You can't poop or pass gas. Get help right away if: Your bulge: Starts to hurt a lot. Changes color. You have sudden pain in your scrotum, or your scrotum changes size. You can't gently push the hernia back in place. You feel like you may vomit, and that feeling does not go away. You keep throwing up or feeling like you need to throw up. These symptoms may be an emergency. Call 911 right away. Do not wait to see if the symptoms will go away. Do not drive yourself to the hospital. This information is  not intended to replace advice given to you by your health care provider. Make sure you discuss any questions you have with your health care provider. Document Revised: 07/29/2023 Document Reviewed: 07/29/2023 Elsevier Patient Education  2024 [employer].

## 2024-04-06 LAB — CBC WITH DIFFERENTIAL/PLATELET
Absolute Lymphocytes: 2183 {cells}/uL (ref 850–3900)
Absolute Monocytes: 389 {cells}/uL (ref 200–950)
Basophils Absolute: 59 {cells}/uL (ref 0–200)
Basophils Relative: 1 %
Eosinophils Absolute: 260 {cells}/uL (ref 15–500)
Eosinophils Relative: 4.4 %
HCT: 41.7 % (ref 38.5–50.0)
Hemoglobin: 14 g/dL (ref 13.2–17.1)
MCH: 32.3 pg (ref 27.0–33.0)
MCHC: 33.6 g/dL (ref 32.0–36.0)
MCV: 96.1 fL (ref 80.0–100.0)
MPV: 10.3 fL (ref 7.5–12.5)
Monocytes Relative: 6.6 %
Neutro Abs: 3009 {cells}/uL (ref 1500–7800)
Neutrophils Relative %: 51 %
Platelets: 319 10*3/uL (ref 140–400)
RBC: 4.34 10*6/uL (ref 4.20–5.80)
RDW: 12.2 % (ref 11.0–15.0)
Total Lymphocyte: 37 %
WBC: 5.9 10*3/uL (ref 3.8–10.8)

## 2024-04-06 LAB — COMPLETE METABOLIC PANEL WITHOUT GFR
AG Ratio: 1.6 (calc) (ref 1.0–2.5)
ALT: 13 U/L (ref 9–46)
AST: 12 U/L (ref 10–40)
Albumin: 4.3 g/dL (ref 3.6–5.1)
Alkaline phosphatase (APISO): 58 U/L (ref 36–130)
BUN: 13 mg/dL (ref 7–25)
CO2: 27 mmol/L (ref 20–32)
Calcium: 9.6 mg/dL (ref 8.6–10.3)
Chloride: 108 mmol/L (ref 98–110)
Creat: 1.17 mg/dL (ref 0.60–1.29)
Globulin: 2.7 g/dL (ref 1.9–3.7)
Glucose, Bld: 95 mg/dL (ref 65–99)
Potassium: 4.4 mmol/L (ref 3.5–5.3)
Sodium: 141 mmol/L (ref 135–146)
Total Bilirubin: 1.2 mg/dL (ref 0.2–1.2)
Total Protein: 7 g/dL (ref 6.1–8.1)

## 2024-04-06 LAB — RPR TITER: RPR Titer: 1:4 {titer} — ABNORMAL HIGH

## 2024-04-06 LAB — HIV-1 RNA QUANT-NO REFLEX-BLD
HIV 1 RNA Quant: 29 {copies}/mL — ABNORMAL HIGH
HIV-1 RNA Quant, Log: 1.46 {Log_copies}/mL — ABNORMAL HIGH

## 2024-04-06 LAB — LIPID PANEL
Cholesterol: 186 mg/dL (ref <200)
HDL: 45 mg/dL (ref >=40)
LDL Cholesterol (Calc): 119 mg/dL — ABNORMAL HIGH
Non-HDL Cholesterol (Calc): 141 mg/dL — ABNORMAL HIGH (ref <130)
Total CHOL/HDL Ratio: 4.1 (calc) (ref <5.0)
Triglycerides: 109 mg/dL (ref <150)

## 2024-04-06 LAB — T PALLIDUM AB: T Pallidum Abs: POSITIVE — AB

## 2024-04-06 LAB — T-HELPER CELLS (CD4) COUNT (NOT AT ARMC)
Absolute CD4: 954 {cells}/uL (ref 490–1740)
CD4 T Helper %: 40 % (ref 30–61)
Total lymphocyte count: 2371 {cells}/uL (ref 850–3900)

## 2024-04-06 LAB — RPR: RPR Ser Ql: REACTIVE — AB

## 2024-05-01 NOTE — Progress Notes (Signed)
 The 10-year ASCVD risk score (Arnett DK, et al., 2019) is: 2.8%   Values used to calculate the score:     Age: 48 years     Sex: Male     Is Non-Hispanic African American: No     Diabetic: No     Tobacco smoker: No     Systolic Blood Pressure: 128 mmHg     Is BP treated: No     HDL Cholesterol: 45 mg/dL     Total Cholesterol: 186 mg/dL  Currently prescribed rosuvastatin  20 mg.   Camauri Fleece, BSN, RN

## 2024-06-27 ENCOUNTER — Ambulatory Visit: Payer: Self-pay | Admitting: Infectious Disease

## 2024-07-20 ENCOUNTER — Other Ambulatory Visit: Payer: Self-pay | Admitting: Infectious Disease

## 2024-07-25 ENCOUNTER — Telehealth: Payer: Self-pay

## 2024-07-25 NOTE — Telephone Encounter (Signed)
 Received refill request for Trazadone 50 Mg tablets from [facility] pharmacy. Will forward message to provider to advise on request. Lorenda CHRISTELLA Code, RMA

## 2024-07-26 MED ORDER — TRAZODONE HCL 50 MG PO TABS: 50.0000 mg | ORAL_TABLET | Freq: Every evening | ORAL | 1 refills | Status: DC | PRN

## 2024-07-26 NOTE — Addendum Note (Signed)
 Addended by: Onesty Clair M on: 07/26/2024 08:21 AM   Modules accepted: Orders
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
## 2024-07-31 ENCOUNTER — Ambulatory Visit: Payer: Self-pay | Admitting: Infectious Disease

## 2024-07-31 ENCOUNTER — Encounter: Payer: Self-pay | Admitting: Infectious Disease

## 2024-07-31 ENCOUNTER — Ambulatory Visit: Payer: Self-pay

## 2024-07-31 ENCOUNTER — Other Ambulatory Visit: Payer: Self-pay

## 2024-07-31 VITALS — BP 119/80 | HR 81 | Temp 97.6°F | Ht 69.0 in | Wt 205.0 lb

## 2024-07-31 DIAGNOSIS — Z5900 Homelessness unspecified: Secondary | ICD-10-CM

## 2024-07-31 DIAGNOSIS — F32A Depression, unspecified: Secondary | ICD-10-CM

## 2024-07-31 DIAGNOSIS — K4090 Unilateral inguinal hernia, without obstruction or gangrene, not specified as recurrent: Secondary | ICD-10-CM

## 2024-07-31 DIAGNOSIS — R85613 High grade squamous intraepithelial lesion on cytologic smear of anus (HGSIL): Secondary | ICD-10-CM

## 2024-07-31 DIAGNOSIS — E785 Hyperlipidemia, unspecified: Secondary | ICD-10-CM

## 2024-07-31 DIAGNOSIS — B20 Human immunodeficiency virus [HIV] disease: Secondary | ICD-10-CM

## 2024-07-31 DIAGNOSIS — Z7185 Encounter for immunization safety counseling: Secondary | ICD-10-CM

## 2024-07-31 MED ORDER — TRAZODONE HCL 50 MG PO TABS: 50.0000 mg | ORAL_TABLET | Freq: Every evening | ORAL | 11 refills | Status: DC | PRN

## 2024-07-31 MED ORDER — BIKTARVY 50-200-25 MG PO TABS: 1.0000 | ORAL_TABLET | Freq: Every day | ORAL | 11 refills | Status: DC

## 2024-07-31 MED ORDER — ROSUVASTATIN CALCIUM 20 MG PO TABS: 20.0000 mg | ORAL_TABLET | Freq: Every day | ORAL | 11 refills | Status: DC

## 2024-07-31 NOTE — Addendum Note (Signed)
 Addended by: GRETEL TULLY HERO on: 07/31/2024 04:07 PM   Modules accepted: Orders

## 2024-08-01 LAB — T-HELPER CELLS (CD4) COUNT (NOT AT ARMC)
CD4 % Helper T Cell: 46 % (ref 33–65)
CD4 T Cell Abs: 1010 /uL (ref 400–1790)

## 2024-08-04 ENCOUNTER — Other Ambulatory Visit (HOSPITAL_COMMUNITY): Payer: Self-pay

## 2024-08-04 ENCOUNTER — Other Ambulatory Visit: Payer: Self-pay

## 2024-08-04 ENCOUNTER — Ambulatory Visit: Payer: Self-pay

## 2024-08-04 DIAGNOSIS — A539 Syphilis, unspecified: Secondary | ICD-10-CM

## 2024-08-04 LAB — COMPLETE METABOLIC PANEL WITHOUT GFR
AG Ratio: 1.8 (calc) (ref 1.0–2.5)
ALT: 15 U/L (ref 9–46)
AST: 14 U/L (ref 10–40)
Albumin: 4.4 g/dL (ref 3.6–5.1)
Alkaline phosphatase (APISO): 58 U/L (ref 36–130)
BUN: 13 mg/dL (ref 7–25)
CO2: 25 mmol/L (ref 20–32)
Calcium: 9.6 mg/dL (ref 8.6–10.3)
Chloride: 109 mmol/L (ref 98–110)
Creat: 1.22 mg/dL (ref 0.60–1.29)
Globulin: 2.4 g/dL (ref 1.9–3.7)
Glucose, Bld: 74 mg/dL (ref 65–99)
Potassium: 4.3 mmol/L (ref 3.5–5.3)
Sodium: 142 mmol/L (ref 135–146)
Total Bilirubin: 1.2 mg/dL (ref 0.2–1.2)
Total Protein: 6.8 g/dL (ref 6.1–8.1)

## 2024-08-04 LAB — CBC WITH DIFFERENTIAL/PLATELET
Absolute Lymphocytes: 2349 {cells}/uL (ref 850–3900)
Absolute Monocytes: 480 {cells}/uL (ref 200–950)
Basophils Absolute: 58 {cells}/uL (ref 0–200)
Basophils Relative: 0.9 %
Eosinophils Absolute: 422 {cells}/uL (ref 15–500)
Eosinophils Relative: 6.6 %
HCT: 41.9 % (ref 38.5–50.0)
Hemoglobin: 14 g/dL (ref 13.2–17.1)
MCH: 32.2 pg (ref 27.0–33.0)
MCHC: 33.4 g/dL (ref 32.0–36.0)
MCV: 96.3 fL (ref 80.0–100.0)
MPV: 10.2 fL (ref 7.5–12.5)
Monocytes Relative: 7.5 %
Neutro Abs: 3091 {cells}/uL (ref 1500–7800)
Neutrophils Relative %: 48.3 %
Platelets: 301 Thousand/uL (ref 140–400)
RBC: 4.35 Million/uL (ref 4.20–5.80)
RDW: 12.5 % (ref 11.0–15.0)
Total Lymphocyte: 36.7 %
WBC: 6.4 Thousand/uL (ref 3.8–10.8)

## 2024-08-04 LAB — HIV-1 RNA QUANT-NO REFLEX-BLD
HIV 1 RNA Quant: <20 {copies}/mL — AB
HIV-1 RNA Quant, Log: <1.3 {Log_copies}/mL — AB

## 2024-08-04 LAB — RPR: RPR Ser Ql: REACTIVE — AB

## 2024-08-04 LAB — T PALLIDUM AB: T Pallidum Abs: POSITIVE — AB

## 2024-08-04 LAB — RPR TITER: RPR Titer: 1:16 {titer} — ABNORMAL HIGH

## 2024-08-04 MED ORDER — DOXYCYCLINE HYCLATE 100 MG PO TABS: 100.0000 mg | ORAL_TABLET | Freq: Two times a day (BID) | ORAL | 0 refills | Status: DC

## 2024-08-04 MED ORDER — DOXYCYCLINE HYCLATE 100 MG PO TABS: 100.0000 mg | ORAL_TABLET | Freq: Two times a day (BID) | ORAL | 0 refills | Status: AC

## 2024-08-04 NOTE — Telephone Encounter (Signed)
 Spoke with patient regarding positive syphillis lab. Answered all questions regarding infection and treatment.  Doxy 100  mg sent to pharmacy.  Lorenda CHRISTELLA Code, RMA

## 2024-08-04 NOTE — Telephone Encounter (Signed)
-----   Message from [place] sent at 08/04/2024 10:40 AM EDT ----- Regarding: FW: Pts RPR is up to 1:16 from the 1:4. He needs 2.4 MU PCN or doxy 100mg  twice daily   14 days ----- Message ----- From: Rebecka Hose Lab Results In Sent: 07/31/2024  11:40 PM EDT To: Jomarie LOISE Fleeta Kathie, MD

## 2024-09-13 ENCOUNTER — Other Ambulatory Visit: Payer: Self-pay | Admitting: Infectious Disease

## 2024-09-13 DIAGNOSIS — A539 Syphilis, unspecified: Secondary | ICD-10-CM

## 2024-09-14 NOTE — Telephone Encounter (Signed)
 Patient completed treatment.   Hallie Ishida, BSN, RN

## 2024-11-01 ENCOUNTER — Ambulatory Visit: Payer: Self-pay | Admitting: Infectious Disease

## 2024-11-07 ENCOUNTER — Encounter: Payer: Self-pay | Admitting: Infectious Disease

## 2024-11-07 DIAGNOSIS — Z1212 Encounter for screening for malignant neoplasm of rectum: Secondary | ICD-10-CM | POA: Insufficient documentation

## 2024-11-07 DIAGNOSIS — A539 Syphilis, unspecified: Secondary | ICD-10-CM | POA: Insufficient documentation

## 2024-11-07 NOTE — Progress Notes (Unsigned)
   Subjective:  Chief complaint: follow-up for HIV disease on medications   Patient ID: Lance Reynolds, male    DOB: 05/01/1976, 48 y.o.   MRN: [id number]  HPI  Past Medical History:  Diagnosis Date   Anxiety    Depression    GERD (gastroesophageal reflux disease)    HIV disease (HCC) 03/14/2023   HIV positive (HCC)    Hyperlipidemia 11/02/2022   Immune deficiency disorder    Insomnia 03/04/2022   Vaccine counseling 11/02/2022    No past surgical history on file.  No family history on file.    Social History   Socioeconomic History   Marital status: Divorced    Spouse name: Not on file   Number of children: Not on file   Years of education: Not on file   Highest education level: Not on file  Occupational History   Not on file  Tobacco Use   Smoking status: Former    Types: Cigarettes    Passive exposure: Never   Smokeless tobacco: Former    Types: Engineer, Drilling   Vaping status: Every Day  Substance and Sexual Activity   Alcohol use: Not Currently    Comment: rarely   Drug use: No   Sexual activity: Yes    Partners: Male    Comment: declined condoms  Other Topics Concern   Not on file  Social History Narrative   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   Social Drivers of Corporate Investment Banker Strain: Not on file  Food Insecurity: Not on file  Transportation Needs: Not on file  Physical Activity: Not on file  Stress: Not on file  Social Connections: Not on file    Allergies  Allergen Reactions   Hydrocodone  Nausea Only   Tramadol Nausea And Vomiting   Percocet [Oxycodone -Acetaminophen ] Nausea And Vomiting     Current Outpatient Medications:    bictegravir-emtricitabine-tenofovir AF (BIKTARVY ) 50-200-25 MG TABS tablet, Take 1 tablet by mouth daily., Disp: 30 tablet, Rfl: 11   rosuvastatin  (CRESTOR ) 20 MG tablet, Take 1 tablet (20 mg total) by mouth daily., Disp: 30 tablet, Rfl: 11   traZODone  (DESYREL ) 50 MG tablet, Take  1 tablet (50 mg total) by mouth at bedtime as needed for sleep., Disp: 30 tablet, Rfl: 11   Review of Systems     Objective:   Physical Exam        Assessment & Plan:

## 2024-11-08 ENCOUNTER — Encounter: Payer: Self-pay | Admitting: Infectious Disease

## 2024-11-08 ENCOUNTER — Other Ambulatory Visit: Payer: Self-pay

## 2024-11-08 ENCOUNTER — Ambulatory Visit: Payer: Self-pay | Admitting: Infectious Disease

## 2024-11-08 VITALS — BP 162/83 | HR 87 | Temp 97.0°F | Ht 69.0 in | Wt 203.0 lb

## 2024-11-08 DIAGNOSIS — E785 Hyperlipidemia, unspecified: Secondary | ICD-10-CM

## 2024-11-08 DIAGNOSIS — Z1212 Encounter for screening for malignant neoplasm of rectum: Secondary | ICD-10-CM

## 2024-11-08 DIAGNOSIS — B20 Human immunodeficiency virus [HIV] disease: Secondary | ICD-10-CM

## 2024-11-08 DIAGNOSIS — Z79899 Other long term (current) drug therapy: Secondary | ICD-10-CM

## 2024-11-08 DIAGNOSIS — Z7185 Encounter for immunization safety counseling: Secondary | ICD-10-CM

## 2024-11-08 DIAGNOSIS — Z23 Encounter for immunization: Secondary | ICD-10-CM

## 2024-11-08 DIAGNOSIS — A539 Syphilis, unspecified: Secondary | ICD-10-CM

## 2024-11-08 DIAGNOSIS — Z113 Encounter for screening for infections with a predominantly sexual mode of transmission: Secondary | ICD-10-CM

## 2024-11-08 MED ORDER — ROSUVASTATIN CALCIUM 20 MG PO TABS: 20.0000 mg | ORAL_TABLET | Freq: Every day | ORAL | 11 refills | Status: AC

## 2024-11-08 MED ORDER — BIKTARVY 50-200-25 MG PO TABS: 1.0000 | ORAL_TABLET | Freq: Every day | ORAL | 11 refills | Status: AC

## 2024-11-08 MED ORDER — TRAZODONE HCL 50 MG PO TABS: 50.0000 mg | ORAL_TABLET | Freq: Every evening | ORAL | 11 refills | Status: DC | PRN

## 2024-11-08 MED ORDER — DOXYCYCLINE HYCLATE 100 MG PO TABS: ORAL_TABLET | ORAL | 4 refills | Status: AC

## 2024-11-09 LAB — GC/CHLAMYDIA PROBE, AMP (THROAT)
Chlamydia trachomatis RNA: NOT DETECTED
Neisseria gonorrhoeae RNA: NOT DETECTED

## 2024-11-09 LAB — CT/NG RNA, TMA RECTAL
Chlamydia Trachomatis RNA: NOT DETECTED
Neisseria Gonorrhoeae RNA: NOT DETECTED

## 2024-11-09 LAB — C. TRACHOMATIS/N. GONORRHOEAE RNA
C. trachomatis RNA, TMA: NOT DETECTED
N. gonorrhoeae RNA, TMA: NOT DETECTED

## 2024-11-12 LAB — HIV-1 RNA QUANT-NO REFLEX-BLD
HIV 1 RNA Quant: NOT DETECTED {copies}/mL
HIV-1 RNA Quant, Log: NOT DETECTED {Log_copies}/mL

## 2024-11-12 LAB — RPR TITER: RPR Titer: 1:2 {titer} — ABNORMAL HIGH

## 2024-11-12 LAB — SYPHILIS: RPR W/REFLEX TO RPR TITER AND TREPONEMAL ANTIBODIES, TRADITIONAL SCREENING AND DIAGNOSIS ALGORITHM: RPR Ser Ql: REACTIVE — AB

## 2024-11-12 LAB — T PALLIDUM AB: T Pallidum Abs: POSITIVE — AB

## 2025-04-18 ENCOUNTER — Ambulatory Visit: Payer: Self-pay | Admitting: Infectious Disease
